# Patient Record
Sex: Female | Born: 1945
Health system: Southern US, Community
[De-identification: ages and names within clinical notes are randomized; demographics above are authoritative.]

## PROBLEM LIST (undated history)

## (undated) DIAGNOSIS — E7800 Pure hypercholesterolemia, unspecified: Secondary | ICD-10-CM

## (undated) DIAGNOSIS — M810 Age-related osteoporosis without current pathological fracture: Secondary | ICD-10-CM

## (undated) HISTORY — PX: CHOLECYSTECTOMY: SHX55

## (undated) HISTORY — PX: EYE SURGERY: SHX253

---

## 2010-07-14 ENCOUNTER — Emergency Department (HOSPITAL_COMMUNITY)
Admission: EM | Admit: 2010-07-14 | Discharge: 2010-07-14 | Disposition: A | Discharge status: Home / Self Care | Payer: Medicare Other | Attending: Emergency Medicine | Admitting: Emergency Medicine

## 2010-07-14 DIAGNOSIS — Z79899 Other long term (current) drug therapy: Secondary | ICD-10-CM | POA: Insufficient documentation

## 2010-07-14 DIAGNOSIS — B372 Candidiasis of skin and nail: Secondary | ICD-10-CM | POA: Insufficient documentation

## 2010-08-01 ENCOUNTER — Ambulatory Visit (INDEPENDENT_AMBULATORY_CARE_PROVIDER_SITE_OTHER): Payer: Medicare Other | Admitting: Otolaryngology

## 2010-08-01 DIAGNOSIS — H8110 Benign paroxysmal vertigo, unspecified ear: Secondary | ICD-10-CM

## 2010-09-05 ENCOUNTER — Ambulatory Visit (INDEPENDENT_AMBULATORY_CARE_PROVIDER_SITE_OTHER): Payer: Medicare Other | Admitting: Otolaryngology

## 2010-09-05 DIAGNOSIS — H8110 Benign paroxysmal vertigo, unspecified ear: Secondary | ICD-10-CM

## 2010-09-26 ENCOUNTER — Ambulatory Visit (INDEPENDENT_AMBULATORY_CARE_PROVIDER_SITE_OTHER): Payer: Medicare Other | Admitting: Otolaryngology

## 2010-09-26 DIAGNOSIS — H6120 Impacted cerumen, unspecified ear: Secondary | ICD-10-CM

## 2010-09-26 DIAGNOSIS — H9209 Otalgia, unspecified ear: Secondary | ICD-10-CM

## 2010-09-26 DIAGNOSIS — H8110 Benign paroxysmal vertigo, unspecified ear: Secondary | ICD-10-CM

## 2011-07-02 ENCOUNTER — Encounter: Payer: Self-pay | Admitting: Pulmonary Disease

## 2011-07-07 ENCOUNTER — Emergency Department (HOSPITAL_COMMUNITY)
Admission: EM | Admit: 2011-07-07 | Discharge: 2011-07-08 | Disposition: A | Discharge status: Home / Self Care | Payer: Medicare Other | Attending: Emergency Medicine | Admitting: Emergency Medicine

## 2011-07-07 ENCOUNTER — Emergency Department (HOSPITAL_COMMUNITY): Payer: Medicare Other

## 2011-07-07 ENCOUNTER — Encounter (HOSPITAL_COMMUNITY): Payer: Self-pay | Admitting: *Deleted

## 2011-07-07 DIAGNOSIS — N898 Other specified noninflammatory disorders of vagina: Secondary | ICD-10-CM | POA: Insufficient documentation

## 2011-07-07 DIAGNOSIS — R112 Nausea with vomiting, unspecified: Secondary | ICD-10-CM | POA: Insufficient documentation

## 2011-07-07 DIAGNOSIS — N939 Abnormal uterine and vaginal bleeding, unspecified: Secondary | ICD-10-CM

## 2011-07-07 DIAGNOSIS — M549 Dorsalgia, unspecified: Secondary | ICD-10-CM | POA: Insufficient documentation

## 2011-07-07 DIAGNOSIS — N949 Unspecified condition associated with female genital organs and menstrual cycle: Secondary | ICD-10-CM | POA: Insufficient documentation

## 2011-07-07 DIAGNOSIS — R109 Unspecified abdominal pain: Secondary | ICD-10-CM | POA: Insufficient documentation

## 2011-07-07 LAB — BASIC METABOLIC PANEL
GFR calc Af Amer: 77 mL/min — ABNORMAL LOW (ref >90)
GFR calc non Af Amer: 66 mL/min — ABNORMAL LOW (ref >90)
Glucose, Bld: 153 mg/dL — ABNORMAL HIGH (ref 70–99)
Potassium: 3.6 mEq/L (ref 3.5–5.1)
Sodium: 139 mEq/L (ref 135–145)

## 2011-07-07 LAB — DIFFERENTIAL
Basophils Relative: 0 % (ref 0–1)
Eosinophils Absolute: 0 10*3/uL (ref 0.0–0.7)
Lymphs Abs: 0.8 10*3/uL (ref 0.7–4.0)
Neutro Abs: 10.4 10*3/uL — ABNORMAL HIGH (ref 1.7–7.7)
Neutrophils Relative %: 87 % — ABNORMAL HIGH (ref 43–77)

## 2011-07-07 LAB — CBC
MCH: 29.4 pg (ref 26.0–34.0)
Platelets: 279 10*3/uL (ref 150–400)
RBC: 4.73 MIL/uL (ref 3.87–5.11)

## 2011-07-07 MED ORDER — SODIUM CHLORIDE 0.9 % IV SOLN: INTRAVENOUS | Status: DC

## 2011-07-07 MED ORDER — SODIUM CHLORIDE 0.9 % IV BOLUS (SEPSIS): 250.0000 mL | Freq: Once | INTRAVENOUS | Status: DC

## 2011-07-07 MED ORDER — HYDROMORPHONE HCL PF 1 MG/ML IJ SOLN
1.0000 mg | Freq: Once | INTRAMUSCULAR | Status: AC
  Administered 2011-07-07: 1 mg via INTRAMUSCULAR

## 2011-07-07 MED ORDER — ONDANSETRON HCL 4 MG/2ML IJ SOLN
4.0000 mg | Freq: Once | INTRAMUSCULAR | Status: DC
  Filled 2011-07-07: qty 2

## 2011-07-07 MED ORDER — ONDANSETRON 4 MG PO TBDP
4.0000 mg | ORAL_TABLET | Freq: Once | ORAL | Status: AC
  Administered 2011-07-07: 4 mg via ORAL
  Filled 2011-07-07: qty 1

## 2011-07-07 MED ORDER — HYDROMORPHONE HCL PF 1 MG/ML IJ SOLN
1.0000 mg | Freq: Once | INTRAMUSCULAR | Status: DC
  Filled 2011-07-07: qty 1

## 2011-07-07 NOTE — ED Notes (Signed)
Attempted x 2 without success in IV access

## 2011-07-07 NOTE — ED Notes (Signed)
Vag bleeding with clots and cramping, Vomiting,.  Low abd pain

## 2011-07-07 NOTE — ED Notes (Signed)
Pt states that cramps started last night and has continued today with vaginal bleeding, +N/V

## 2011-07-07 NOTE — ED Provider Notes (Addendum)
History     CSN: [id number]  Arrival date & time 07/07/11  1733   First MD Initiated Contact with Patient 07/07/11 1939      Chief Complaint  Patient presents with  . Vaginal Bleeding    (Consider location/radiation/quality/duration/timing/severity/associated sxs/prior treatment) The history is provided by the patient.  the patient is a 66 year old female primary care Dr. Is Dr. Juanetta Humphrey. Patient with some vaginal spotting for the past year. Has been taking estrogen for hot flashes related to postmenopausal symptoms. One month ago she had vaginal bleeding for 14 days. Then starting here today with heavy vaginal bleeding and clots lower Donald pain and some nausea and vomiting. Patient did stop the estrogen that she was on how on June 12 there may be a relationship. The abdominal pain is more crampy in nature nonradiating is very sharp at times at worst pain is 8/10 currently is about a 4/10. Patient was referred by her primary care doctor to [facility] she has an appointment in 2 weeks.  History reviewed. No pertinent past medical history.  Past Surgical History  Procedure Date  . Cholecystectomy     History reviewed. No pertinent family history.  History  Substance Use Topics  . Smoking status: Never Smoker   . Smokeless tobacco: Not on file  . Alcohol Use: No    OB History    Grav Para Term Preterm Abortions TAB SAB Ect Mult Living                  Review of Systems  Constitutional: Negative for fever and chills.  HENT: Negative for congestion and neck pain.   Eyes: Negative for redness.  Respiratory: Negative for shortness of breath.   Cardiovascular: Negative for chest pain.  Gastrointestinal: Positive for nausea, vomiting and abdominal pain. Negative for diarrhea.  Genitourinary: Positive for vaginal bleeding and pelvic pain. Negative for dysuria and vaginal discharge.  Musculoskeletal: Positive for back pain.  Skin: Negative for rash.  Neurological:  Negative for headaches.  Hematological: Does not bruise/bleed easily.    Allergies  Other  Home Medications   Current Outpatient Rx  Name Route Sig Dispense Refill  . OLOPATADINE HCL 0.2 % OP SOLN Ophthalmic Apply 1 drop to eye daily.    Marland Kitchen HYDROCODONE-ACETAMINOPHEN 5-325 MG PO TABS Oral Take 1 tablet by mouth every 4 (four) hours as needed for pain. 10 tablet 0  . ONDANSETRON HCL 4 MG PO TABS Oral Take 1 tablet (4 mg total) by mouth every 6 (six) hours. 12 tablet 0    BP 101/85  Pulse 74  Temp 98.4 F (36.9 C) (Oral)  Resp 16  Ht 5\' 1"  (1.549 m)  Wt 215 lb (97.523 kg)  BMI 40.62 kg/m2  SpO2 94%  Physical Exam  Nursing note and vitals reviewed. Constitutional: She is oriented to person, place, and time. She appears well-developed and well-nourished. No distress.  HENT:  Head: Normocephalic and atraumatic.  Mouth/Throat: Oropharynx is clear and moist.  Eyes: Conjunctivae and EOM are normal. Pupils are equal, round, and reactive to light.  Neck: Normal range of motion. Neck supple.  Cardiovascular: Normal rate, regular rhythm and normal heart sounds.   No murmur heard. Pulmonary/Chest: Effort normal and breath sounds normal.  Abdominal: Soft. Bowel sounds are normal. She exhibits no distension and no mass. There is no tenderness. There is no rebound and no guarding.  Genitourinary: Uterus normal. No vaginal discharge found.       On pelvic exam  external genitalia is normalvaginal vault with a small amount of blood no clots no heavy bleeding cervix nontender no cervical motion tenderness uterus difficult to tell size may be a little enlarged nontender adnexal nontender.  Musculoskeletal: Normal range of motion.  Lymphadenopathy:    She has no cervical adenopathy.  Neurological: She is alert and oriented to person, place, and time. No cranial nerve deficit. She exhibits normal muscle tone. Coordination normal.  Skin: Skin is warm. No rash noted.    ED Course  Procedures  (including critical care time)  Labs Reviewed  CBC - Abnormal; Notable for the following:    WBC 11.9 (*)     All other components within normal limits  DIFFERENTIAL - Abnormal; Notable for the following:    Neutrophils Relative 87 (*)     Neutro Abs 10.4 (*)     Lymphocytes Relative 7 (*)     All other components within normal limits  BASIC METABOLIC PANEL - Abnormal; Notable for the following:    Glucose, Bld 153 (*)     GFR calc non Af Amer 66 (*)     GFR calc Af Amer 77 (*)     All other components within normal limits  LAB REPORT - SCANNED   Ct Abdomen Pelvis Wo Contrast  07/07/2011  *RADIOLOGY REPORT*  Clinical Data: Vaginal bleeding.  CT ABDOMEN AND PELVIS WITHOUT CONTRAST  Technique:  Multidetector CT imaging of the abdomen and pelvis was performed following the standard protocol without intravenous contrast.  Comparison: None.  Findings: The visualized lung bases are clear.  The liver and spleen are unremarkable in appearance.  The patient is status post cholecystectomy, with clips noted along the gallbladder fossa.  The pancreas and adrenal glands are unremarkable.  The kidneys are unremarkable in appearance.  There is no evidence of hydronephrosis.  No renal or ureteral stones are seen.  No perinephric stranding is appreciated.  No free fluid is identified.  The small bowel is unremarkable in appearance.  The stomach is within normal limits.  No acute vascular abnormalities are seen.  The appendix is normal in caliber and contains minimal contrast, without evidence for appendicitis.  Minimal diverticulosis is noted at the distal descending colon.  The colon is otherwise unremarkable in appearance.  Contrast progresses to the level of the mid transverse colon.  The bladder is largely decompressed and grossly unremarkable in appearance.  The uterus is grossly unremarkable in appearance; a small hypodensity at the cervix is thought to reflect a Nabothian cyst, though this cannot be  confirmed on CT.  The ovaries are relatively symmetric; no suspicious adnexal masses are seen.  No inguinal lymphadenopathy is seen.  No acute osseous abnormalities are identified.  Chronic sclerotic degenerative change is noted within the L5 vertebral body, with endplate irregularity and this space narrowing at L4-L5, and vacuum phenomenon L5-S1.  An associated focus of degenerative fat is seen within the vertebral body.  This may reflect remote sequelae of infection.  IMPRESSION:  1.  Uterus grossly unremarkable in appearance on CT, though difficult to fully assess on noncontrast images; a small hypodensity at the cervix is thought to reflect a Nabothian cyst, though this cannot be confirmed on CT. 2.  Minimal diverticulosis along the distal descending colon, without evidence for diverticulitis.  3.  Chronic sclerotic degenerative change within the L5 vertebral body, with surrounding degenerative change and associated intravertebral fat.  This may reflect remote sequelae of infection; no definite acute abnormalities identified.  Original  Report Authenticated By: Tonia Ghent, M.D.   Results for orders placed during the hospital encounter of 07/07/11  CBC      Component Value Range   WBC 11.9 (*) 4.0 - 10.5 K/uL   RBC 4.73  3.87 - 5.11 MIL/uL   Hemoglobin 13.9  12.0 - 15.0 g/dL   HCT 16.1  09.6 - 04.5 %   MCV 87.5  78.0 - 100.0 fL   MCH 29.4  26.0 - 34.0 pg   MCHC 33.6  30.0 - 36.0 g/dL   RDW 40.9  81.1 - 91.4 %   Platelets 279  150 - 400 K/uL  DIFFERENTIAL      Component Value Range   Neutrophils Relative 87 (*) 43 - 77 %   Neutro Abs 10.4 (*) 1.7 - 7.7 K/uL   Lymphocytes Relative 7 (*) 12 - 46 %   Lymphs Abs 0.8  0.7 - 4.0 K/uL   Monocytes Relative 6  3 - 12 %   Monocytes Absolute 0.7  0.1 - 1.0 K/uL   Eosinophils Relative 0  0 - 5 %   Eosinophils Absolute 0.0  0.0 - 0.7 K/uL   Basophils Relative 0  0 - 1 %   Basophils Absolute 0.0  0.0 - 0.1 K/uL  BASIC METABOLIC PANEL      Component  Value Range   Sodium 139  135 - 145 mEq/L   Potassium 3.6  3.5 - 5.1 mEq/L   Chloride 101  96 - 112 mEq/L   CO2 26  19 - 32 mEq/L   Glucose, Bld 153 (*) 70 - 99 mg/dL   BUN 19  6 - 23 mg/dL   Creatinine, Ser 7.82  0.50 - 1.10 mg/dL   Calcium 95.6  8.4 - 21.3 mg/dL   GFR calc non Af Amer 66 (*) >90 mL/min   GFR calc Af Amer 77 (*) >90 mL/min     1. Vaginal bleeding   2. Abdominal pain       MDM  Patient with some vaginal bleeding and spotting for the past year she is clearly postmenopausal. Dr. Juanetta Humphrey her primary care doctor is [facility] referral to OB/GYN locally. First appointment for her is in the beginning of July. Patient a month ago had bleeding for about 14 days vaginally. Then starting just today started with heavy bleeding and clots and cramping and some vomiting associated with low Donald pain. Patient has been on estrogen she stopped that on June 12 just a few days ago that may be related. CT is being ordered to evaluate further followup with GYN will be important. Complete blood count without any significant anemia.  CT results are still pending and will be followed up by the overnight emergency physician. followup with OB/GYN will be important. Uterine biopsy may be required.        Shelda Jakes, MD 07/07/11 0865  Shelda Jakes, MD 07/08/11 1327

## 2011-07-08 MED ORDER — ONDANSETRON HCL 4 MG PO TABS: 4.0000 mg | ORAL_TABLET | Freq: Four times a day (QID) | ORAL | Status: AC

## 2011-07-08 MED ORDER — HYDROCODONE-ACETAMINOPHEN 5-325 MG PO TABS: 1.0000 | ORAL_TABLET | ORAL | Status: AC | PRN

## 2011-07-08 NOTE — Discharge Instructions (Signed)
Your CT did not show anything serious in the abdomen. Use the pain and nausea medicine as needed.Keep your appointment with Dr. Despina Hidden. Call his office tomorrow and see if there are any earlier appointments and place yourself on the first cancellation list.    Abnormal Vaginal Bleeding Abnormal vaginal bleeding means bleeding from the vagina that is not your normal menstrual period. Bleeding may be heavy or light. It may last for days or come and go. There are many problems that may cause this. HOME CARE  Keep track of your periods on a calendar if they are not regular.   Write down:   How often pads or tampons are changed.   The size and number of clots, if there are any.   A change in the color of the blood.   A change in the amount of blood.   Any smell.   The time and strength of cramps or pain.   Limit activity as told.   Eat a healthy diet.   Do not have sex (intercourse) until your doctor says it is okay.   Never have unprotected sex unless you are trying to get pregnant.   Only take medicine as told by your doctor.  GET HELP RIGHT AWAY IF:   You get dizzy or feel faint when standing up.   You have to change pads or tampons more than once an hour.   You feel a sudden change in your pain.   You start bleeding heavily.   You develop a fever.  MAKE SURE YOU:  Understand these instructions.   Will watch your condition.   Will get help right away if you are not doing well or get worse.  Document Released: 11/03/2008 Document Revised: 12/26/2010 Document Reviewed: 11/03/2008 [facility] Patient Information 2012 [place], [place].

## 2011-07-08 NOTE — Progress Notes (Signed)
2300 Assumed care/disposition of patient here with dysfunctional uterine bleeding. There has been associated cramping and nausea and vomiting. Awaiting CT abdomen and pelvis. 6213 CT is negative for acute process. Reviewed results with patient. She advised that she has had intermittent vaginal bleeding for over a year. She has had two D& Cs in her lifetime. Her appointment with Dr. Despina Hidden, OB/GYN is the first week of July. She reports she was on estrogen in the past but that had been discontinued. Will provide her with Rx for nausea and pain. She will keep her appointment with Dr. Despina Hidden and call his office to be placed on first cancellation list.Pt stable in ED with no significant deterioration in condition.The patient appears reasonably screened and/or stabilized for discharge and I doubt any other medical condition or other [facility] requiring further screening, evaluation, or treatment in the ED at this time prior to discharge.  Results for orders placed during the hospital encounter of 07/07/11  CBC      Component Value Range   WBC 11.9 (*) 4.0 - 10.5 K/uL   RBC 4.73  3.87 - 5.11 MIL/uL   Hemoglobin 13.9  12.0 - 15.0 g/dL   HCT 08.6  57.8 - 46.9 %   MCV 87.5  78.0 - 100.0 fL   MCH 29.4  26.0 - 34.0 pg   MCHC 33.6  30.0 - 36.0 g/dL   RDW 62.9  52.8 - 41.3 %   Platelets 279  150 - 400 K/uL  DIFFERENTIAL      Component Value Range   Neutrophils Relative 87 (*) 43 - 77 %   Neutro Abs 10.4 (*) 1.7 - 7.7 K/uL   Lymphocytes Relative 7 (*) 12 - 46 %   Lymphs Abs 0.8  0.7 - 4.0 K/uL   Monocytes Relative 6  3 - 12 %   Monocytes Absolute 0.7  0.1 - 1.0 K/uL   Eosinophils Relative 0  0 - 5 %   Eosinophils Absolute 0.0  0.0 - 0.7 K/uL   Basophils Relative 0  0 - 1 %   Basophils Absolute 0.0  0.0 - 0.1 K/uL  BASIC METABOLIC PANEL      Component Value Range   Sodium 139  135 - 145 mEq/L   Potassium 3.6  3.5 - 5.1 mEq/L   Chloride 101  96 - 112 mEq/L   CO2 26  19 - 32 mEq/L   Glucose, Bld 153 (*) 70 - 99  mg/dL   BUN 19  6 - 23 mg/dL   Creatinine, Ser 2.44  0.50 - 1.10 mg/dL   Calcium 01.0  8.4 - 27.2 mg/dL   GFR calc non Af Amer 66 (*) >90 mL/min   GFR calc Af Amer 77 (*) >90 mL/min   Ct Abdomen Pelvis Wo Contrast  07/07/2011  *RADIOLOGY REPORT*  Clinical Data: Vaginal bleeding.  CT ABDOMEN AND PELVIS WITHOUT CONTRAST  Technique:  Multidetector CT imaging of the abdomen and pelvis was performed following the standard protocol without intravenous contrast.  Comparison: None.  Findings: The visualized lung bases are clear.  The liver and spleen are unremarkable in appearance.  The patient is status post cholecystectomy, with clips noted along the gallbladder fossa.  The pancreas and adrenal glands are unremarkable.  The kidneys are unremarkable in appearance.  There is no evidence of hydronephrosis.  No renal or ureteral stones are seen.  No perinephric stranding is appreciated.  No free fluid is identified.  The small bowel is unremarkable in  appearance.  The stomach is within normal limits.  No acute vascular abnormalities are seen.  The appendix is normal in caliber and contains minimal contrast, without evidence for appendicitis.  Minimal diverticulosis is noted at the distal descending colon.  The colon is otherwise unremarkable in appearance.  Contrast progresses to the level of the mid transverse colon.  The bladder is largely decompressed and grossly unremarkable in appearance.  The uterus is grossly unremarkable in appearance; a small hypodensity at the cervix is thought to reflect a Nabothian cyst, though this cannot be confirmed on CT.  The ovaries are relatively symmetric; no suspicious adnexal masses are seen.  No inguinal lymphadenopathy is seen.  No acute osseous abnormalities are identified.  Chronic sclerotic degenerative change is noted within the L5 vertebral body, with endplate irregularity and this space narrowing at L4-L5, and vacuum phenomenon L5-S1.  An associated focus of degenerative  fat is seen within the vertebral body.  This may reflect remote sequelae of infection.  IMPRESSION:  1.  Uterus grossly unremarkable in appearance on CT, though difficult to fully assess on noncontrast images; a small hypodensity at the cervix is thought to reflect a Nabothian cyst, though this cannot be confirmed on CT. 2.  Minimal diverticulosis along the distal descending colon, without evidence for diverticulitis.  3.  Chronic sclerotic degenerative change within the L5 vertebral body, with surrounding degenerative change and associated intravertebral fat.  This may reflect remote sequelae of infection; no definite acute abnormalities identified.  Original Report Authenticated By: Tonia Ghent, M.D.

## 2012-04-19 ENCOUNTER — Other Ambulatory Visit: Payer: Self-pay | Admitting: Obstetrics & Gynecology

## 2012-04-21 ENCOUNTER — Other Ambulatory Visit (HOSPITAL_COMMUNITY): Payer: Self-pay | Admitting: Neurological Surgery

## 2012-04-21 DIAGNOSIS — I671 Cerebral aneurysm, nonruptured: Secondary | ICD-10-CM

## 2012-04-26 ENCOUNTER — Ambulatory Visit (HOSPITAL_COMMUNITY)
Admission: RE | Admit: 2012-04-26 | Discharge: 2012-04-26 | Disposition: A | Discharge status: Home / Self Care | Payer: Medicare Other | Source: Ambulatory Visit | Attending: Neurological Surgery | Admitting: Neurological Surgery

## 2012-04-26 DIAGNOSIS — I671 Cerebral aneurysm, nonruptured: Secondary | ICD-10-CM

## 2012-04-26 DIAGNOSIS — H5789 Other specified disorders of eye and adnexa: Secondary | ICD-10-CM | POA: Insufficient documentation

## 2012-04-26 DIAGNOSIS — I6789 Other cerebrovascular disease: Secondary | ICD-10-CM | POA: Insufficient documentation

## 2012-04-26 DIAGNOSIS — H5710 Ocular pain, unspecified eye: Secondary | ICD-10-CM | POA: Insufficient documentation

## 2012-04-26 DIAGNOSIS — G319 Degenerative disease of nervous system, unspecified: Secondary | ICD-10-CM | POA: Insufficient documentation

## 2012-04-26 DIAGNOSIS — H532 Diplopia: Secondary | ICD-10-CM | POA: Insufficient documentation

## 2012-04-26 LAB — POCT I-STAT, CHEM 8
Chloride: 112 mEq/L (ref 96–112)
HCT: 43 % (ref 36.0–46.0)
Potassium: 3.5 mEq/L (ref 3.5–5.1)

## 2012-04-26 MED ORDER — GADOBENATE DIMEGLUMINE 529 MG/ML IV SOLN
20.0000 mL | Freq: Once | INTRAVENOUS | Status: AC | PRN
  Administered 2012-04-26: 20 mL via INTRAVENOUS

## 2012-04-26 NOTE — Progress Notes (Signed)
Blood sample obtained from left arm IV for Creatnine level.  

## 2012-05-17 ENCOUNTER — Other Ambulatory Visit: Payer: Self-pay | Admitting: Obstetrics & Gynecology

## 2012-06-15 ENCOUNTER — Other Ambulatory Visit: Payer: Self-pay | Admitting: Obstetrics & Gynecology

## 2012-07-19 ENCOUNTER — Other Ambulatory Visit: Payer: Self-pay | Admitting: Obstetrics & Gynecology

## 2012-11-18 ENCOUNTER — Other Ambulatory Visit: Payer: Self-pay | Admitting: *Deleted

## 2012-11-18 MED ORDER — MEDROXYPROGESTERONE ACETATE 10 MG PO TABS: ORAL_TABLET | ORAL | Status: DC

## 2013-03-19 ENCOUNTER — Other Ambulatory Visit: Payer: Self-pay | Admitting: Obstetrics & Gynecology

## 2013-03-21 ENCOUNTER — Other Ambulatory Visit: Payer: Medicare Other | Admitting: Obstetrics & Gynecology

## 2013-03-21 ENCOUNTER — Encounter (INDEPENDENT_AMBULATORY_CARE_PROVIDER_SITE_OTHER): Payer: Self-pay

## 2013-06-16 ENCOUNTER — Other Ambulatory Visit (HOSPITAL_COMMUNITY): Payer: Self-pay | Admitting: Pulmonary Disease

## 2013-06-16 DIAGNOSIS — I639 Cerebral infarction, unspecified: Secondary | ICD-10-CM

## 2013-06-22 ENCOUNTER — Ambulatory Visit (HOSPITAL_COMMUNITY)
Admission: RE | Admit: 2013-06-22 | Discharge: 2013-06-22 | Disposition: A | Discharge status: Home / Self Care | Payer: Medicare Other | Source: Ambulatory Visit | Attending: Pulmonary Disease | Admitting: Pulmonary Disease

## 2013-06-22 DIAGNOSIS — I517 Cardiomegaly: Secondary | ICD-10-CM

## 2013-06-22 DIAGNOSIS — R42 Dizziness and giddiness: Secondary | ICD-10-CM | POA: Insufficient documentation

## 2013-06-22 DIAGNOSIS — Z8673 Personal history of transient ischemic attack (TIA), and cerebral infarction without residual deficits: Secondary | ICD-10-CM | POA: Insufficient documentation

## 2013-06-22 DIAGNOSIS — H539 Unspecified visual disturbance: Secondary | ICD-10-CM | POA: Insufficient documentation

## 2013-06-22 DIAGNOSIS — I6350 Cerebral infarction due to unspecified occlusion or stenosis of unspecified cerebral artery: Secondary | ICD-10-CM | POA: Insufficient documentation

## 2013-06-22 DIAGNOSIS — I639 Cerebral infarction, unspecified: Secondary | ICD-10-CM

## 2013-06-22 NOTE — Progress Notes (Signed)
  Echocardiogram 2D Echocardiogram has been performed.  Renae Fickle Erie Radu 06/22/2013, 2:40 PM

## 2013-11-28 LAB — HM DIABETES EYE EXAM

## 2015-01-25 DIAGNOSIS — I69998 Other sequelae following unspecified cerebrovascular disease: Secondary | ICD-10-CM | POA: Diagnosis not present

## 2015-01-25 DIAGNOSIS — H409 Unspecified glaucoma: Secondary | ICD-10-CM | POA: Diagnosis not present

## 2015-02-01 DIAGNOSIS — H4051X1 Glaucoma secondary to other eye disorders, right eye, mild stage: Secondary | ICD-10-CM | POA: Diagnosis not present

## 2015-02-01 DIAGNOSIS — H35031 Hypertensive retinopathy, right eye: Secondary | ICD-10-CM | POA: Diagnosis not present

## 2015-02-01 DIAGNOSIS — H35033 Hypertensive retinopathy, bilateral: Secondary | ICD-10-CM | POA: Diagnosis not present

## 2015-02-01 DIAGNOSIS — Z8679 Personal history of other diseases of the circulatory system: Secondary | ICD-10-CM | POA: Diagnosis not present

## 2015-02-01 DIAGNOSIS — H40022 Open angle with borderline findings, high risk, left eye: Secondary | ICD-10-CM | POA: Diagnosis not present

## 2015-02-01 DIAGNOSIS — H25013 Cortical age-related cataract, bilateral: Secondary | ICD-10-CM | POA: Diagnosis not present

## 2015-02-01 DIAGNOSIS — H43813 Vitreous degeneration, bilateral: Secondary | ICD-10-CM | POA: Diagnosis not present

## 2015-08-08 DIAGNOSIS — H1013 Acute atopic conjunctivitis, bilateral: Secondary | ICD-10-CM | POA: Diagnosis not present

## 2015-08-08 DIAGNOSIS — H4051X1 Glaucoma secondary to other eye disorders, right eye, mild stage: Secondary | ICD-10-CM | POA: Diagnosis not present

## 2015-08-08 DIAGNOSIS — Z79899 Other long term (current) drug therapy: Secondary | ICD-10-CM | POA: Diagnosis not present

## 2015-08-08 DIAGNOSIS — H40022 Open angle with borderline findings, high risk, left eye: Secondary | ICD-10-CM | POA: Diagnosis not present

## 2015-09-20 DIAGNOSIS — Z634 Disappearance and death of family member: Secondary | ICD-10-CM | POA: Diagnosis not present

## 2015-09-20 DIAGNOSIS — H409 Unspecified glaucoma: Secondary | ICD-10-CM | POA: Diagnosis not present

## 2015-09-20 DIAGNOSIS — J309 Allergic rhinitis, unspecified: Secondary | ICD-10-CM | POA: Diagnosis not present

## 2015-09-20 DIAGNOSIS — G4700 Insomnia, unspecified: Secondary | ICD-10-CM | POA: Diagnosis not present

## 2015-10-04 DIAGNOSIS — H4051X1 Glaucoma secondary to other eye disorders, right eye, mild stage: Secondary | ICD-10-CM | POA: Diagnosis not present

## 2015-10-04 DIAGNOSIS — H43813 Vitreous degeneration, bilateral: Secondary | ICD-10-CM | POA: Diagnosis not present

## 2015-10-04 DIAGNOSIS — H2513 Age-related nuclear cataract, bilateral: Secondary | ICD-10-CM | POA: Diagnosis not present

## 2015-10-04 DIAGNOSIS — H40022 Open angle with borderline findings, high risk, left eye: Secondary | ICD-10-CM | POA: Diagnosis not present

## 2015-12-24 DIAGNOSIS — J309 Allergic rhinitis, unspecified: Secondary | ICD-10-CM | POA: Diagnosis not present

## 2015-12-24 DIAGNOSIS — G4700 Insomnia, unspecified: Secondary | ICD-10-CM | POA: Diagnosis not present

## 2015-12-24 DIAGNOSIS — M13862 Other specified arthritis, left knee: Secondary | ICD-10-CM | POA: Diagnosis not present

## 2016-04-14 DIAGNOSIS — H40022 Open angle with borderline findings, high risk, left eye: Secondary | ICD-10-CM | POA: Diagnosis not present

## 2016-04-14 DIAGNOSIS — H21561 Pupillary abnormality, right eye: Secondary | ICD-10-CM | POA: Diagnosis not present

## 2016-04-14 DIAGNOSIS — H4051X1 Glaucoma secondary to other eye disorders, right eye, mild stage: Secondary | ICD-10-CM | POA: Diagnosis not present

## 2016-06-23 DIAGNOSIS — G4700 Insomnia, unspecified: Secondary | ICD-10-CM | POA: Diagnosis not present

## 2016-06-23 DIAGNOSIS — H409 Unspecified glaucoma: Secondary | ICD-10-CM | POA: Diagnosis not present

## 2016-06-23 DIAGNOSIS — J301 Allergic rhinitis due to pollen: Secondary | ICD-10-CM | POA: Diagnosis not present

## 2016-10-09 DIAGNOSIS — H35041 Retinal micro-aneurysms, unspecified, right eye: Secondary | ICD-10-CM | POA: Diagnosis not present

## 2016-10-09 DIAGNOSIS — H3589 Other specified retinal disorders: Secondary | ICD-10-CM | POA: Diagnosis not present

## 2016-10-09 DIAGNOSIS — H4051X1 Glaucoma secondary to other eye disorders, right eye, mild stage: Secondary | ICD-10-CM | POA: Diagnosis not present

## 2016-10-09 DIAGNOSIS — H40022 Open angle with borderline findings, high risk, left eye: Secondary | ICD-10-CM | POA: Diagnosis not present

## 2016-11-06 DIAGNOSIS — H35371 Puckering of macula, right eye: Secondary | ICD-10-CM | POA: Diagnosis not present

## 2016-11-06 DIAGNOSIS — H25813 Combined forms of age-related cataract, bilateral: Secondary | ICD-10-CM | POA: Diagnosis not present

## 2016-11-06 DIAGNOSIS — H34831 Tributary (branch) retinal vein occlusion, right eye, with macular edema: Secondary | ICD-10-CM | POA: Diagnosis not present

## 2016-11-06 DIAGNOSIS — H4051X1 Glaucoma secondary to other eye disorders, right eye, mild stage: Secondary | ICD-10-CM | POA: Diagnosis not present

## 2016-11-10 DIAGNOSIS — H4051X1 Glaucoma secondary to other eye disorders, right eye, mild stage: Secondary | ICD-10-CM | POA: Diagnosis not present

## 2016-11-24 DIAGNOSIS — H40022 Open angle with borderline findings, high risk, left eye: Secondary | ICD-10-CM | POA: Diagnosis not present

## 2017-01-07 DIAGNOSIS — H1013 Acute atopic conjunctivitis, bilateral: Secondary | ICD-10-CM | POA: Diagnosis not present

## 2017-01-07 DIAGNOSIS — H4051X1 Glaucoma secondary to other eye disorders, right eye, mild stage: Secondary | ICD-10-CM | POA: Diagnosis not present

## 2017-01-07 DIAGNOSIS — H40022 Open angle with borderline findings, high risk, left eye: Secondary | ICD-10-CM | POA: Diagnosis not present

## 2017-01-07 DIAGNOSIS — H40053 Ocular hypertension, bilateral: Secondary | ICD-10-CM | POA: Diagnosis not present

## 2017-01-28 DIAGNOSIS — H409 Unspecified glaucoma: Secondary | ICD-10-CM | POA: Diagnosis not present

## 2017-01-28 DIAGNOSIS — J301 Allergic rhinitis due to pollen: Secondary | ICD-10-CM | POA: Diagnosis not present

## 2017-02-19 DIAGNOSIS — H34831 Tributary (branch) retinal vein occlusion, right eye, with macular edema: Secondary | ICD-10-CM | POA: Diagnosis not present

## 2017-02-19 DIAGNOSIS — H35371 Puckering of macula, right eye: Secondary | ICD-10-CM | POA: Diagnosis not present

## 2017-02-19 DIAGNOSIS — H25813 Combined forms of age-related cataract, bilateral: Secondary | ICD-10-CM | POA: Diagnosis not present

## 2017-02-19 DIAGNOSIS — H4051X1 Glaucoma secondary to other eye disorders, right eye, mild stage: Secondary | ICD-10-CM | POA: Diagnosis not present

## 2017-04-08 ENCOUNTER — Other Ambulatory Visit (HOSPITAL_COMMUNITY): Payer: Self-pay | Admitting: Pulmonary Disease

## 2017-04-08 DIAGNOSIS — Z1231 Encounter for screening mammogram for malignant neoplasm of breast: Secondary | ICD-10-CM

## 2017-04-09 ENCOUNTER — Ambulatory Visit (HOSPITAL_COMMUNITY)
Admission: RE | Admit: 2017-04-09 | Discharge: 2017-04-09 | Disposition: A | Discharge status: Home / Self Care | Payer: PPO | Source: Ambulatory Visit | Attending: Pulmonary Disease | Admitting: Pulmonary Disease

## 2017-04-09 ENCOUNTER — Encounter (HOSPITAL_COMMUNITY): Payer: Self-pay

## 2017-04-09 DIAGNOSIS — Z1231 Encounter for screening mammogram for malignant neoplasm of breast: Secondary | ICD-10-CM | POA: Diagnosis not present

## 2017-05-27 DIAGNOSIS — H40022 Open angle with borderline findings, high risk, left eye: Secondary | ICD-10-CM | POA: Diagnosis not present

## 2017-05-27 DIAGNOSIS — H1013 Acute atopic conjunctivitis, bilateral: Secondary | ICD-10-CM | POA: Diagnosis not present

## 2017-05-27 DIAGNOSIS — H21561 Pupillary abnormality, right eye: Secondary | ICD-10-CM | POA: Diagnosis not present

## 2017-05-27 DIAGNOSIS — H4051X1 Glaucoma secondary to other eye disorders, right eye, mild stage: Secondary | ICD-10-CM | POA: Diagnosis not present

## 2017-07-28 DIAGNOSIS — Z0000 Encounter for general adult medical examination without abnormal findings: Secondary | ICD-10-CM | POA: Diagnosis not present

## 2017-07-29 ENCOUNTER — Encounter: Payer: Self-pay | Admitting: Pulmonary Disease

## 2017-07-29 DIAGNOSIS — Z0000 Encounter for general adult medical examination without abnormal findings: Secondary | ICD-10-CM | POA: Diagnosis not present

## 2017-07-29 DIAGNOSIS — T7849XS Other allergy, sequela: Secondary | ICD-10-CM | POA: Diagnosis not present

## 2017-07-29 DIAGNOSIS — Z634 Disappearance and death of family member: Secondary | ICD-10-CM | POA: Diagnosis not present

## 2017-07-29 DIAGNOSIS — H409 Unspecified glaucoma: Secondary | ICD-10-CM | POA: Diagnosis not present

## 2017-07-29 DIAGNOSIS — J309 Allergic rhinitis, unspecified: Secondary | ICD-10-CM | POA: Diagnosis not present

## 2017-07-29 DIAGNOSIS — N939 Abnormal uterine and vaginal bleeding, unspecified: Secondary | ICD-10-CM | POA: Diagnosis not present

## 2017-07-29 DIAGNOSIS — M13862 Other specified arthritis, left knee: Secondary | ICD-10-CM | POA: Diagnosis not present

## 2017-07-29 DIAGNOSIS — I69998 Other sequelae following unspecified cerebrovascular disease: Secondary | ICD-10-CM | POA: Diagnosis not present

## 2017-07-30 LAB — TSH: TSH: 4.3

## 2017-10-01 DIAGNOSIS — H34831 Tributary (branch) retinal vein occlusion, right eye, with macular edema: Secondary | ICD-10-CM | POA: Diagnosis not present

## 2017-10-01 DIAGNOSIS — H35371 Puckering of macula, right eye: Secondary | ICD-10-CM | POA: Diagnosis not present

## 2017-10-01 DIAGNOSIS — H25813 Combined forms of age-related cataract, bilateral: Secondary | ICD-10-CM | POA: Diagnosis not present

## 2017-10-01 DIAGNOSIS — H4051X1 Glaucoma secondary to other eye disorders, right eye, mild stage: Secondary | ICD-10-CM | POA: Diagnosis not present

## 2017-12-14 DIAGNOSIS — H35033 Hypertensive retinopathy, bilateral: Secondary | ICD-10-CM | POA: Diagnosis not present

## 2017-12-14 DIAGNOSIS — H4051X1 Glaucoma secondary to other eye disorders, right eye, mild stage: Secondary | ICD-10-CM | POA: Diagnosis not present

## 2017-12-14 DIAGNOSIS — H40022 Open angle with borderline findings, high risk, left eye: Secondary | ICD-10-CM | POA: Diagnosis not present

## 2017-12-14 DIAGNOSIS — H34831 Tributary (branch) retinal vein occlusion, right eye, with macular edema: Secondary | ICD-10-CM | POA: Diagnosis not present

## 2018-02-01 DIAGNOSIS — E785 Hyperlipidemia, unspecified: Secondary | ICD-10-CM | POA: Diagnosis not present

## 2018-02-01 DIAGNOSIS — J301 Allergic rhinitis due to pollen: Secondary | ICD-10-CM | POA: Diagnosis not present

## 2018-02-01 DIAGNOSIS — M13862 Other specified arthritis, left knee: Secondary | ICD-10-CM | POA: Diagnosis not present

## 2018-02-01 DIAGNOSIS — I69998 Other sequelae following unspecified cerebrovascular disease: Secondary | ICD-10-CM | POA: Diagnosis not present

## 2018-02-10 DIAGNOSIS — Z1212 Encounter for screening for malignant neoplasm of rectum: Secondary | ICD-10-CM | POA: Diagnosis not present

## 2018-02-10 DIAGNOSIS — Z1211 Encounter for screening for malignant neoplasm of colon: Secondary | ICD-10-CM | POA: Diagnosis not present

## 2018-02-10 LAB — COLOGUARD: Cologuard: NEGATIVE

## 2018-03-08 ENCOUNTER — Other Ambulatory Visit (HOSPITAL_COMMUNITY): Payer: Self-pay | Admitting: Pulmonary Disease

## 2018-03-08 DIAGNOSIS — Z1231 Encounter for screening mammogram for malignant neoplasm of breast: Secondary | ICD-10-CM

## 2018-04-12 ENCOUNTER — Ambulatory Visit (HOSPITAL_COMMUNITY): Payer: PPO

## 2018-05-10 ENCOUNTER — Ambulatory Visit (HOSPITAL_COMMUNITY): Payer: PPO

## 2018-06-18 DIAGNOSIS — H401111 Primary open-angle glaucoma, right eye, mild stage: Secondary | ICD-10-CM | POA: Diagnosis not present

## 2018-06-18 DIAGNOSIS — H40022 Open angle with borderline findings, high risk, left eye: Secondary | ICD-10-CM | POA: Diagnosis not present

## 2018-06-18 DIAGNOSIS — H1045 Other chronic allergic conjunctivitis: Secondary | ICD-10-CM | POA: Diagnosis not present

## 2018-06-28 ENCOUNTER — Ambulatory Visit (HOSPITAL_COMMUNITY)
Admission: RE | Admit: 2018-06-28 | Discharge: 2018-06-28 | Disposition: A | Discharge status: Home / Self Care | Payer: PPO | Source: Ambulatory Visit | Attending: Pulmonary Disease | Admitting: Pulmonary Disease

## 2018-06-28 ENCOUNTER — Other Ambulatory Visit: Payer: Self-pay

## 2018-06-28 DIAGNOSIS — Z1231 Encounter for screening mammogram for malignant neoplasm of breast: Secondary | ICD-10-CM | POA: Diagnosis not present

## 2018-06-28 LAB — HM MAMMOGRAPHY

## 2018-07-31 LAB — LIPID PANEL
Cholesterol, Total: 243
HDL Cholesterol: 89 — AB (ref 35–70)
LDL Cholesterol: 140
Triglycerides: 70 (ref 40–160)
VLDL Cholesterol Cal: 14

## 2018-08-02 ENCOUNTER — Other Ambulatory Visit (HOSPITAL_COMMUNITY): Payer: Self-pay | Admitting: Pulmonary Disease

## 2018-08-02 DIAGNOSIS — Z0000 Encounter for general adult medical examination without abnormal findings: Secondary | ICD-10-CM | POA: Diagnosis not present

## 2018-08-02 DIAGNOSIS — I69998 Other sequelae following unspecified cerebrovascular disease: Secondary | ICD-10-CM | POA: Diagnosis not present

## 2018-08-02 DIAGNOSIS — M13862 Other specified arthritis, left knee: Secondary | ICD-10-CM | POA: Diagnosis not present

## 2018-08-02 DIAGNOSIS — H409 Unspecified glaucoma: Secondary | ICD-10-CM | POA: Diagnosis not present

## 2018-08-02 DIAGNOSIS — Z780 Asymptomatic menopausal state: Secondary | ICD-10-CM

## 2018-08-03 LAB — URINALYSIS
Bilirubin: NEGATIVE
Casts: NONE SEEN
Epithelial Cells (non renal): >10
Glucose: NEGATIVE
Ketones: NEGATIVE
Nitrite: NEGATIVE
Occult Blood: NEGATIVE
Specific Gravity: 1.024
Urobilinogen, Ur: 0.2
pH: 5.5

## 2018-08-03 LAB — CBC WITH DIFF/PLATELET
BASO(ABSOLUTE): 0.1
Basophils: 1
Eosinophils Absolute: 0
Eosinophils, %: 1
HCT: 40 (ref 29–41)
Hemoglobin: 13.8
Immature Granulocytes: 0
LYMPH: 29 %
Lymphs Abs: 2.6
MCH: 30
MCHC: 34.4
MCV: 87 (ref 76–111)
Monocytes(Absolute): 0.7
Monocytes: 7
Neutro Abs: 5.4
Neutrophils: 62
RBC: 4.6 (ref 3.87–5.11)
RDW: 13.3
WBC: 8.8
platelet count: 251

## 2018-08-03 LAB — LIPID PANEL
Cholesterol, Total: 165
HDL Cholesterol: 67 (ref 35–70)
LDL Cholesterol: 83
Triglycerides: 76 (ref 40–160)
VLDL Cholesterol Cal: 15

## 2018-08-03 LAB — CMP 10231
ALT: 27 (ref 3–30)
AST: 27
Albumin/Globulin Ratio: 1.9
Albumin: 4.4
Alkaline Phosphatase: 142
BUN/Creatinine Ratio: 14
BUN: 13 (ref 4–21)
Calcium: 10.1
Carbon Dioxide, Total: 22
Chloride: 103
Creat: 0.96
EGFR (African American): 67
EGFR (Non-African Amer.): 58
Globulin, Total: 2.3
Glucose: 72
Potassium: 4.2
Sodium: 143
Total Bilirubin: 0.4
Total Protein: 6.7 (ref 6.4–8.2)

## 2018-08-03 LAB — TSH: TSH: 2.6

## 2018-08-19 ENCOUNTER — Other Ambulatory Visit: Payer: Self-pay

## 2019-01-03 DIAGNOSIS — H2513 Age-related nuclear cataract, bilateral: Secondary | ICD-10-CM | POA: Diagnosis not present

## 2019-01-03 DIAGNOSIS — H34831 Tributary (branch) retinal vein occlusion, right eye, with macular edema: Secondary | ICD-10-CM | POA: Diagnosis not present

## 2019-01-03 DIAGNOSIS — H401111 Primary open-angle glaucoma, right eye, mild stage: Secondary | ICD-10-CM | POA: Diagnosis not present

## 2019-01-03 DIAGNOSIS — H25013 Cortical age-related cataract, bilateral: Secondary | ICD-10-CM | POA: Diagnosis not present

## 2019-01-03 DIAGNOSIS — H40022 Open angle with borderline findings, high risk, left eye: Secondary | ICD-10-CM | POA: Diagnosis not present

## 2019-01-18 DIAGNOSIS — H25811 Combined forms of age-related cataract, right eye: Secondary | ICD-10-CM | POA: Diagnosis not present

## 2019-01-18 DIAGNOSIS — H2511 Age-related nuclear cataract, right eye: Secondary | ICD-10-CM | POA: Diagnosis not present

## 2019-02-16 DIAGNOSIS — H2512 Age-related nuclear cataract, left eye: Secondary | ICD-10-CM | POA: Diagnosis not present

## 2019-02-16 DIAGNOSIS — H25012 Cortical age-related cataract, left eye: Secondary | ICD-10-CM | POA: Diagnosis not present

## 2019-02-22 DIAGNOSIS — H25012 Cortical age-related cataract, left eye: Secondary | ICD-10-CM | POA: Diagnosis not present

## 2019-02-22 DIAGNOSIS — H2512 Age-related nuclear cataract, left eye: Secondary | ICD-10-CM | POA: Diagnosis not present

## 2019-02-22 DIAGNOSIS — H25812 Combined forms of age-related cataract, left eye: Secondary | ICD-10-CM | POA: Diagnosis not present

## 2019-03-12 ENCOUNTER — Ambulatory Visit: Payer: PPO | Attending: Internal Medicine

## 2019-03-12 DIAGNOSIS — Z23 Encounter for immunization: Secondary | ICD-10-CM | POA: Insufficient documentation

## 2019-03-12 NOTE — Progress Notes (Signed)
   Covid-19 Vaccination Clinic  Name:  Sheila Humphrey    MRN: [id number] DOB: 1945/11/11  03/12/2019  Sheila Humphrey was observed post Covid-19 immunization for 15 minutes without incidence. She was provided with Vaccine Information Sheet and instruction to access the V-Safe system.   Sheila Humphrey was instructed to call 911 with any severe reactions post vaccine: Marland Kitchen Difficulty breathing  . Swelling of your face and throat  . A fast heartbeat  . A bad rash all over your body  . Dizziness and weakness    Immunizations Administered    Name Date Dose VIS Date Route   Pfizer COVID-19 Vaccine 03/12/2019  9:13 AM 0.3 mL 12/31/2018 Intramuscular   Manufacturer: [employer], [employer]   Lot: [id number]   NDC: [address]-9

## 2019-04-05 ENCOUNTER — Ambulatory Visit: Payer: PPO | Attending: Internal Medicine

## 2019-04-05 DIAGNOSIS — Z23 Encounter for immunization: Secondary | ICD-10-CM

## 2019-04-05 NOTE — Progress Notes (Signed)
   Covid-19 Vaccination Clinic  Name:  Sheila Humphrey    MRN: [id number] DOB: [birth year]  04/05/2019  [name] was observed post Covid-19 immunization for 15 minutes without incident. She was provided with Vaccine Information Sheet and instruction to access the V-Safe system.   [name] was instructed to call 911 with any severe reactions post vaccine: Marland Kitchen Difficulty breathing  . Swelling of face and throat  . A fast heartbeat  . A bad rash all over body  . Dizziness and weakness   Immunizations Administered    Name Date Dose VIS Date Route   Pfizer COVID-19 Vaccine 04/05/2019 12:25 PM 0.3 mL 12/31/2018 Intramuscular   Manufacturer: [employer], [employer]   Lot: [id number]   NDC: [address]-7

## 2019-05-24 DIAGNOSIS — H40022 Open angle with borderline findings, high risk, left eye: Secondary | ICD-10-CM | POA: Diagnosis not present

## 2019-05-24 DIAGNOSIS — H401111 Primary open-angle glaucoma, right eye, mild stage: Secondary | ICD-10-CM | POA: Diagnosis not present

## 2019-06-29 DIAGNOSIS — E782 Mixed hyperlipidemia: Secondary | ICD-10-CM | POA: Diagnosis not present

## 2019-06-29 DIAGNOSIS — J302 Other seasonal allergic rhinitis: Secondary | ICD-10-CM | POA: Diagnosis not present

## 2019-06-29 DIAGNOSIS — Z0189 Encounter for other specified special examinations: Secondary | ICD-10-CM | POA: Diagnosis not present

## 2019-06-29 DIAGNOSIS — Z6841 Body Mass Index (BMI) 40.0 and over, adult: Secondary | ICD-10-CM | POA: Diagnosis not present

## 2019-07-13 DIAGNOSIS — Z0000 Encounter for general adult medical examination without abnormal findings: Secondary | ICD-10-CM | POA: Diagnosis not present

## 2019-07-13 DIAGNOSIS — E785 Hyperlipidemia, unspecified: Secondary | ICD-10-CM | POA: Diagnosis not present

## 2019-07-20 DIAGNOSIS — Z6841 Body Mass Index (BMI) 40.0 and over, adult: Secondary | ICD-10-CM | POA: Diagnosis not present

## 2019-07-20 DIAGNOSIS — J302 Other seasonal allergic rhinitis: Secondary | ICD-10-CM | POA: Diagnosis not present

## 2019-07-20 DIAGNOSIS — E782 Mixed hyperlipidemia: Secondary | ICD-10-CM | POA: Diagnosis not present

## 2019-08-08 ENCOUNTER — Other Ambulatory Visit (HOSPITAL_COMMUNITY): Payer: Self-pay | Admitting: Internal Medicine

## 2019-08-08 DIAGNOSIS — Z1231 Encounter for screening mammogram for malignant neoplasm of breast: Secondary | ICD-10-CM

## 2019-08-10 ENCOUNTER — Ambulatory Visit (HOSPITAL_COMMUNITY)
Admission: RE | Admit: 2019-08-10 | Discharge: 2019-08-10 | Disposition: A | Discharge status: Home / Self Care | Payer: PPO | Source: Ambulatory Visit | Attending: Internal Medicine | Admitting: Internal Medicine

## 2019-08-10 ENCOUNTER — Other Ambulatory Visit: Payer: Self-pay

## 2019-08-10 DIAGNOSIS — Z1231 Encounter for screening mammogram for malignant neoplasm of breast: Secondary | ICD-10-CM | POA: Insufficient documentation

## 2019-09-05 DIAGNOSIS — H40022 Open angle with borderline findings, high risk, left eye: Secondary | ICD-10-CM | POA: Diagnosis not present

## 2019-09-05 DIAGNOSIS — H35033 Hypertensive retinopathy, bilateral: Secondary | ICD-10-CM | POA: Diagnosis not present

## 2019-09-05 DIAGNOSIS — Z961 Presence of intraocular lens: Secondary | ICD-10-CM | POA: Diagnosis not present

## 2019-09-05 DIAGNOSIS — H401111 Primary open-angle glaucoma, right eye, mild stage: Secondary | ICD-10-CM | POA: Diagnosis not present

## 2020-01-12 DIAGNOSIS — Z6841 Body Mass Index (BMI) 40.0 and over, adult: Secondary | ICD-10-CM | POA: Diagnosis not present

## 2020-01-12 DIAGNOSIS — J302 Other seasonal allergic rhinitis: Secondary | ICD-10-CM | POA: Diagnosis not present

## 2020-01-12 DIAGNOSIS — E782 Mixed hyperlipidemia: Secondary | ICD-10-CM | POA: Diagnosis not present

## 2020-01-18 DIAGNOSIS — R945 Abnormal results of liver function studies: Secondary | ICD-10-CM | POA: Diagnosis not present

## 2020-01-18 DIAGNOSIS — E782 Mixed hyperlipidemia: Secondary | ICD-10-CM | POA: Diagnosis not present

## 2020-01-18 DIAGNOSIS — Z0001 Encounter for general adult medical examination with abnormal findings: Secondary | ICD-10-CM | POA: Diagnosis not present

## 2020-01-18 DIAGNOSIS — J302 Other seasonal allergic rhinitis: Secondary | ICD-10-CM | POA: Diagnosis not present

## 2020-01-18 DIAGNOSIS — Z6841 Body Mass Index (BMI) 40.0 and over, adult: Secondary | ICD-10-CM | POA: Diagnosis not present

## 2020-01-18 DIAGNOSIS — H8110 Benign paroxysmal vertigo, unspecified ear: Secondary | ICD-10-CM | POA: Diagnosis not present

## 2020-01-27 ENCOUNTER — Other Ambulatory Visit (HOSPITAL_COMMUNITY): Payer: Self-pay | Admitting: Internal Medicine

## 2020-01-27 DIAGNOSIS — Z13820 Encounter for screening for osteoporosis: Secondary | ICD-10-CM

## 2020-02-07 ENCOUNTER — Other Ambulatory Visit (HOSPITAL_COMMUNITY): Payer: PPO

## 2020-02-16 ENCOUNTER — Ambulatory Visit (HOSPITAL_COMMUNITY)
Admission: RE | Admit: 2020-02-16 | Discharge: 2020-02-16 | Disposition: A | Discharge status: Home / Self Care | Payer: PPO | Source: Ambulatory Visit | Attending: Internal Medicine | Admitting: Internal Medicine

## 2020-02-16 ENCOUNTER — Other Ambulatory Visit: Payer: Self-pay

## 2020-02-16 DIAGNOSIS — Z780 Asymptomatic menopausal state: Secondary | ICD-10-CM | POA: Insufficient documentation

## 2020-02-16 DIAGNOSIS — Z13820 Encounter for screening for osteoporosis: Secondary | ICD-10-CM | POA: Insufficient documentation

## 2020-02-16 DIAGNOSIS — M810 Age-related osteoporosis without current pathological fracture: Secondary | ICD-10-CM | POA: Insufficient documentation

## 2020-02-16 DIAGNOSIS — Z8739 Personal history of other diseases of the musculoskeletal system and connective tissue: Secondary | ICD-10-CM | POA: Diagnosis not present

## 2020-03-08 DIAGNOSIS — H401111 Primary open-angle glaucoma, right eye, mild stage: Secondary | ICD-10-CM | POA: Diagnosis not present

## 2020-03-08 DIAGNOSIS — H40022 Open angle with borderline findings, high risk, left eye: Secondary | ICD-10-CM | POA: Diagnosis not present

## 2020-03-21 NOTE — Discharge Instructions (Signed)
Denosumab injection What is this medicine? DENOSUMAB (den oh sue mab) slows bone breakdown. Prolia is used to treat osteoporosis in women after menopause and in men, and in people who are taking corticosteroids for 6 months or more. Xgeva is used to treat a high calcium level due to cancer and to prevent bone fractures and other bone problems caused by multiple myeloma or cancer bone metastases. Xgeva is also used to treat giant cell tumor of the bone. This medicine may be used for other purposes; ask your health care provider or pharmacist if you have questions. COMMON BRAND NAME(S): Prolia, XGEVA What should I tell my health care provider before I take this medicine? They need to know if you have any of these conditions:  dental disease  having surgery or tooth extraction  infection  kidney disease  low levels of calcium or Vitamin D in the blood  malnutrition  on hemodialysis  skin conditions or sensitivity  thyroid or parathyroid disease  an unusual reaction to denosumab, other medicines, foods, dyes, or preservatives  pregnant or trying to get pregnant  breast-feeding How should I use this medicine? This medicine is for injection under the skin. It is given by a health care professional in a hospital or clinic setting. A special MedGuide will be given to you before each treatment. Be sure to read this information carefully each time. For Prolia, talk to your pediatrician regarding the use of this medicine in children. Special care may be needed. For Xgeva, talk to your pediatrician regarding the use of this medicine in children. While this drug may be prescribed for children as young as 13 years for selected conditions, precautions do apply. Overdosage: If you think you have taken too much of this medicine contact a poison control center or emergency room at once. NOTE: This medicine is only for you. Do not share this medicine with others. What if I miss a dose? It is  important not to miss your dose. Call your doctor or health care professional if you are unable to keep an appointment. What may interact with this medicine? Do not take this medicine with any of the following medications:  other medicines containing denosumab This medicine may also interact with the following medications:  medicines that lower your chance of fighting infection  steroid medicines like prednisone or cortisone This list may not describe all possible interactions. Give your health care provider a list of all the medicines, herbs, non-prescription drugs, or dietary supplements you use. Also tell them if you smoke, drink alcohol, or use illegal drugs. Some items may interact with your medicine. What should I watch for while using this medicine? Visit your doctor or health care professional for regular checks on your progress. Your doctor or health care professional may order blood tests and other tests to see how you are doing. Call your doctor or health care professional for advice if you get a fever, chills or sore throat, or other symptoms of a cold or flu. Do not treat yourself. This drug may decrease your body's ability to fight infection. Try to avoid being around people who are sick. You should make sure you get enough calcium and vitamin D while you are taking this medicine, unless your doctor tells you not to. Discuss the foods you eat and the vitamins you take with your health care professional. See your dentist regularly. Brush and floss your teeth as directed. Before you have any dental work done, tell your dentist you are   receiving this medicine. Do not become pregnant while taking this medicine or for 5 months after stopping it. Talk with your doctor or health care professional about your birth control options while taking this medicine. Women should inform their doctor if they wish to become pregnant or think they might be pregnant. There is a potential for serious side  effects to an unborn child. Talk to your health care professional or pharmacist for more information. What side effects may I notice from receiving this medicine? Side effects that you should report to your doctor or health care professional as soon as possible:  allergic reactions like skin rash, itching or hives, swelling of the face, lips, or tongue  bone pain  breathing problems  dizziness  jaw pain, especially after dental work  redness, blistering, peeling of the skin  signs and symptoms of infection like fever or chills; cough; sore throat; pain or trouble passing urine  signs of low calcium like fast heartbeat, muscle cramps or muscle pain; pain, tingling, numbness in the hands or feet; seizures  unusual bleeding or bruising  unusually weak or tired Side effects that usually do not require medical attention (report to your doctor or health care professional if they continue or are bothersome):  constipation  diarrhea  headache  joint pain  loss of appetite  muscle pain  runny nose  tiredness  upset stomach This list may not describe all possible side effects. Call your doctor for medical advice about side effects. You may report side effects to FDA at 1-800-FDA-1088. Where should I keep my medicine? This medicine is only given in a clinic, doctor's office, or other health care setting and will not be stored at home. NOTE: This sheet is a summary. It may not cover all possible information. If you have questions about this medicine, talk to your doctor, pharmacist, or health care provider.  2021 [employer]vier/Gold Standard (2017-05-15 16:10:44)

## 2020-03-26 ENCOUNTER — Other Ambulatory Visit: Payer: Self-pay

## 2020-03-26 ENCOUNTER — Encounter (HOSPITAL_COMMUNITY): Payer: Self-pay

## 2020-03-26 ENCOUNTER — Encounter (HOSPITAL_COMMUNITY)
Admission: RE | Admit: 2020-03-26 | Discharge: 2020-03-26 | Disposition: A | Discharge status: Home / Self Care | Payer: PPO | Source: Ambulatory Visit | Attending: Internal Medicine | Admitting: Internal Medicine

## 2020-03-26 DIAGNOSIS — M810 Age-related osteoporosis without current pathological fracture: Secondary | ICD-10-CM | POA: Insufficient documentation

## 2020-03-26 HISTORY — DX: Pure hypercholesterolemia, unspecified: E78.00

## 2020-03-26 HISTORY — DX: Age-related osteoporosis without current pathological fracture: M81.0

## 2020-03-26 MED ORDER — DENOSUMAB 60 MG/ML ~~LOC~~ SOSY
60.0000 mg | PREFILLED_SYRINGE | Freq: Once | SUBCUTANEOUS | Status: AC
  Administered 2020-03-26: 60 mg via SUBCUTANEOUS

## 2020-04-13 DIAGNOSIS — H401111 Primary open-angle glaucoma, right eye, mild stage: Secondary | ICD-10-CM | POA: Diagnosis not present

## 2020-05-18 DIAGNOSIS — H401111 Primary open-angle glaucoma, right eye, mild stage: Secondary | ICD-10-CM | POA: Diagnosis not present

## 2020-05-18 DIAGNOSIS — H40022 Open angle with borderline findings, high risk, left eye: Secondary | ICD-10-CM | POA: Diagnosis not present

## 2020-06-22 DIAGNOSIS — H40022 Open angle with borderline findings, high risk, left eye: Secondary | ICD-10-CM | POA: Diagnosis not present

## 2020-06-22 DIAGNOSIS — H401111 Primary open-angle glaucoma, right eye, mild stage: Secondary | ICD-10-CM | POA: Diagnosis not present

## 2020-07-04 ENCOUNTER — Other Ambulatory Visit (HOSPITAL_COMMUNITY): Payer: Self-pay | Admitting: Internal Medicine

## 2020-07-04 DIAGNOSIS — Z1231 Encounter for screening mammogram for malignant neoplasm of breast: Secondary | ICD-10-CM

## 2020-07-05 DIAGNOSIS — R945 Abnormal results of liver function studies: Secondary | ICD-10-CM | POA: Diagnosis not present

## 2020-07-05 DIAGNOSIS — Z79899 Other long term (current) drug therapy: Secondary | ICD-10-CM | POA: Diagnosis not present

## 2020-07-11 DIAGNOSIS — M810 Age-related osteoporosis without current pathological fracture: Secondary | ICD-10-CM | POA: Diagnosis not present

## 2020-07-11 DIAGNOSIS — R42 Dizziness and giddiness: Secondary | ICD-10-CM | POA: Diagnosis not present

## 2020-07-11 DIAGNOSIS — J302 Other seasonal allergic rhinitis: Secondary | ICD-10-CM | POA: Diagnosis not present

## 2020-07-11 DIAGNOSIS — Z23 Encounter for immunization: Secondary | ICD-10-CM | POA: Diagnosis not present

## 2020-07-11 DIAGNOSIS — R945 Abnormal results of liver function studies: Secondary | ICD-10-CM | POA: Diagnosis not present

## 2020-07-11 DIAGNOSIS — E782 Mixed hyperlipidemia: Secondary | ICD-10-CM | POA: Diagnosis not present

## 2020-07-11 DIAGNOSIS — N1831 Chronic kidney disease, stage 3a: Secondary | ICD-10-CM | POA: Diagnosis not present

## 2020-08-13 ENCOUNTER — Ambulatory Visit (HOSPITAL_COMMUNITY)
Admission: RE | Admit: 2020-08-13 | Discharge: 2020-08-13 | Disposition: A | Discharge status: Home / Self Care | Payer: PPO | Source: Ambulatory Visit | Attending: Internal Medicine | Admitting: Internal Medicine

## 2020-08-13 ENCOUNTER — Other Ambulatory Visit: Payer: Self-pay

## 2020-08-13 DIAGNOSIS — Z1231 Encounter for screening mammogram for malignant neoplasm of breast: Secondary | ICD-10-CM | POA: Diagnosis not present

## 2020-08-31 DIAGNOSIS — H524 Presbyopia: Secondary | ICD-10-CM | POA: Diagnosis not present

## 2020-08-31 DIAGNOSIS — H34831 Tributary (branch) retinal vein occlusion, right eye, with macular edema: Secondary | ICD-10-CM | POA: Diagnosis not present

## 2020-08-31 DIAGNOSIS — H35033 Hypertensive retinopathy, bilateral: Secondary | ICD-10-CM | POA: Diagnosis not present

## 2020-08-31 DIAGNOSIS — H40022 Open angle with borderline findings, high risk, left eye: Secondary | ICD-10-CM | POA: Diagnosis not present

## 2020-08-31 DIAGNOSIS — H401111 Primary open-angle glaucoma, right eye, mild stage: Secondary | ICD-10-CM | POA: Diagnosis not present

## 2020-09-26 ENCOUNTER — Encounter (HOSPITAL_COMMUNITY)
Admission: RE | Admit: 2020-09-26 | Discharge: 2020-09-26 | Disposition: A | Discharge status: Home / Self Care | Payer: PPO | Source: Ambulatory Visit | Attending: Internal Medicine | Admitting: Internal Medicine

## 2020-09-26 ENCOUNTER — Other Ambulatory Visit: Payer: Self-pay

## 2020-09-26 DIAGNOSIS — M810 Age-related osteoporosis without current pathological fracture: Secondary | ICD-10-CM | POA: Insufficient documentation

## 2020-09-26 MED ORDER — DENOSUMAB 60 MG/ML ~~LOC~~ SOSY
PREFILLED_SYRINGE | SUBCUTANEOUS | Status: AC
  Administered 2020-09-26: 60 mg via SUBCUTANEOUS
  Filled 2020-09-26: qty 1

## 2020-09-26 MED ORDER — DENOSUMAB 60 MG/ML ~~LOC~~ SOSY: 60.0000 mg | PREFILLED_SYRINGE | Freq: Once | SUBCUTANEOUS | Status: AC

## 2021-01-16 DIAGNOSIS — M810 Age-related osteoporosis without current pathological fracture: Secondary | ICD-10-CM | POA: Diagnosis not present

## 2021-01-16 DIAGNOSIS — E782 Mixed hyperlipidemia: Secondary | ICD-10-CM | POA: Diagnosis not present

## 2021-01-24 DIAGNOSIS — M810 Age-related osteoporosis without current pathological fracture: Secondary | ICD-10-CM | POA: Diagnosis not present

## 2021-01-24 DIAGNOSIS — R945 Abnormal results of liver function studies: Secondary | ICD-10-CM | POA: Diagnosis not present

## 2021-01-24 DIAGNOSIS — E782 Mixed hyperlipidemia: Secondary | ICD-10-CM | POA: Diagnosis not present

## 2021-01-24 DIAGNOSIS — N1831 Chronic kidney disease, stage 3a: Secondary | ICD-10-CM | POA: Diagnosis not present

## 2021-01-24 DIAGNOSIS — R42 Dizziness and giddiness: Secondary | ICD-10-CM | POA: Diagnosis not present

## 2021-01-24 DIAGNOSIS — Z0001 Encounter for general adult medical examination with abnormal findings: Secondary | ICD-10-CM | POA: Diagnosis not present

## 2021-03-07 DIAGNOSIS — H401111 Primary open-angle glaucoma, right eye, mild stage: Secondary | ICD-10-CM | POA: Diagnosis not present

## 2021-03-07 DIAGNOSIS — H40022 Open angle with borderline findings, high risk, left eye: Secondary | ICD-10-CM | POA: Diagnosis not present

## 2021-03-26 ENCOUNTER — Encounter (HOSPITAL_COMMUNITY)
Admission: RE | Admit: 2021-03-26 | Discharge: 2021-03-26 | Disposition: A | Discharge status: Home / Self Care | Payer: PPO | Source: Ambulatory Visit | Attending: Internal Medicine | Admitting: Internal Medicine

## 2021-03-26 ENCOUNTER — Other Ambulatory Visit: Payer: Self-pay

## 2021-03-26 DIAGNOSIS — M810 Age-related osteoporosis without current pathological fracture: Secondary | ICD-10-CM | POA: Insufficient documentation

## 2021-03-26 MED ORDER — DENOSUMAB 60 MG/ML ~~LOC~~ SOSY: 60.0000 mg | PREFILLED_SYRINGE | Freq: Once | SUBCUTANEOUS | Status: AC

## 2021-03-26 MED ORDER — DENOSUMAB 60 MG/ML ~~LOC~~ SOSY
PREFILLED_SYRINGE | SUBCUTANEOUS | Status: AC
  Administered 2021-03-26: 60 mg via SUBCUTANEOUS
  Filled 2021-03-26: qty 1

## 2021-07-08 ENCOUNTER — Other Ambulatory Visit (HOSPITAL_COMMUNITY): Payer: Self-pay | Admitting: Internal Medicine

## 2021-07-08 DIAGNOSIS — Z1231 Encounter for screening mammogram for malignant neoplasm of breast: Secondary | ICD-10-CM

## 2021-07-18 DIAGNOSIS — E782 Mixed hyperlipidemia: Secondary | ICD-10-CM | POA: Diagnosis not present

## 2021-07-18 DIAGNOSIS — N1831 Chronic kidney disease, stage 3a: Secondary | ICD-10-CM | POA: Diagnosis not present

## 2021-07-24 DIAGNOSIS — E782 Mixed hyperlipidemia: Secondary | ICD-10-CM | POA: Diagnosis not present

## 2021-07-24 DIAGNOSIS — Z1211 Encounter for screening for malignant neoplasm of colon: Secondary | ICD-10-CM | POA: Diagnosis not present

## 2021-07-24 DIAGNOSIS — Z6835 Body mass index (BMI) 35.0-35.9, adult: Secondary | ICD-10-CM | POA: Diagnosis not present

## 2021-07-24 DIAGNOSIS — R945 Abnormal results of liver function studies: Secondary | ICD-10-CM | POA: Diagnosis not present

## 2021-07-24 DIAGNOSIS — N1831 Chronic kidney disease, stage 3a: Secondary | ICD-10-CM | POA: Diagnosis not present

## 2021-07-24 DIAGNOSIS — E6609 Other obesity due to excess calories: Secondary | ICD-10-CM | POA: Diagnosis not present

## 2021-07-24 DIAGNOSIS — M810 Age-related osteoporosis without current pathological fracture: Secondary | ICD-10-CM | POA: Diagnosis not present

## 2021-07-29 DIAGNOSIS — Z1211 Encounter for screening for malignant neoplasm of colon: Secondary | ICD-10-CM | POA: Diagnosis not present

## 2021-08-06 LAB — COLOGUARD: COLOGUARD: NEGATIVE

## 2021-08-06 LAB — EXTERNAL GENERIC LAB PROCEDURE: COLOGUARD: NEGATIVE

## 2021-08-15 ENCOUNTER — Ambulatory Visit (HOSPITAL_COMMUNITY)
Admission: RE | Admit: 2021-08-15 | Discharge: 2021-08-15 | Disposition: A | Discharge status: Home / Self Care | Payer: PPO | Source: Ambulatory Visit | Attending: Internal Medicine | Admitting: Internal Medicine

## 2021-08-15 DIAGNOSIS — Z1231 Encounter for screening mammogram for malignant neoplasm of breast: Secondary | ICD-10-CM | POA: Diagnosis not present

## 2021-09-17 IMAGING — MG MM DIGITAL SCREENING BILAT W/ TOMO AND CAD
8 series · 8 of 24 positions shown · non-contrast
Comparison: Previous exam(s).

CLINICAL DATA: Screening.

EXAM:
DIGITAL SCREENING BILATERAL MAMMOGRAM WITH TOMOSYNTHESIS AND CAD
TECHNIQUE: Bilateral screening digital craniocaudal and mediolateral oblique
mammograms were obtained. Bilateral screening digital breast
tomosynthesis was performed. The images were evaluated with
computer-aided detection.

[L CC synth-2D]
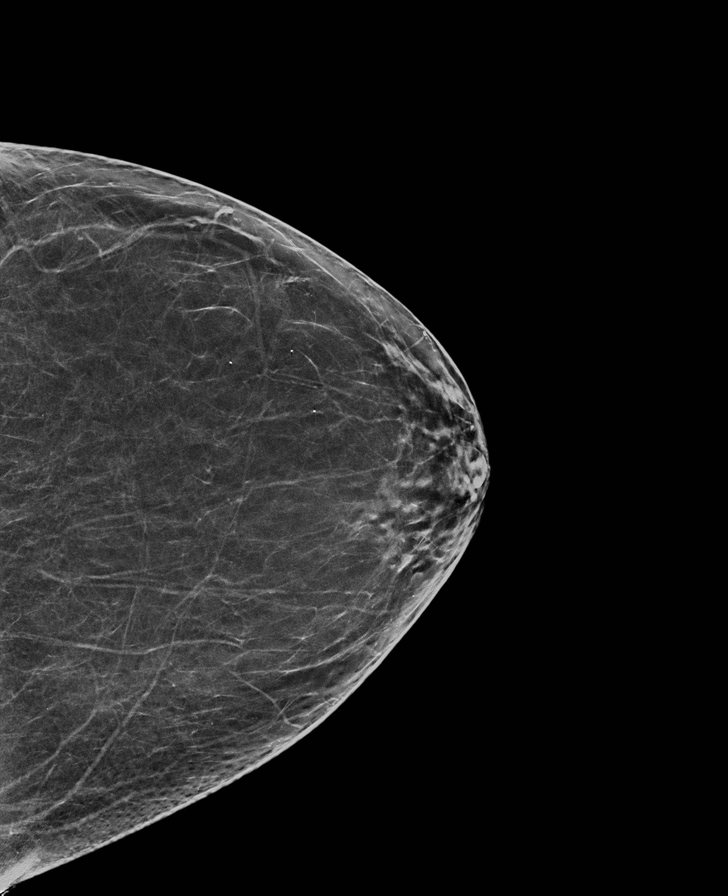

[R CC synth-2D]
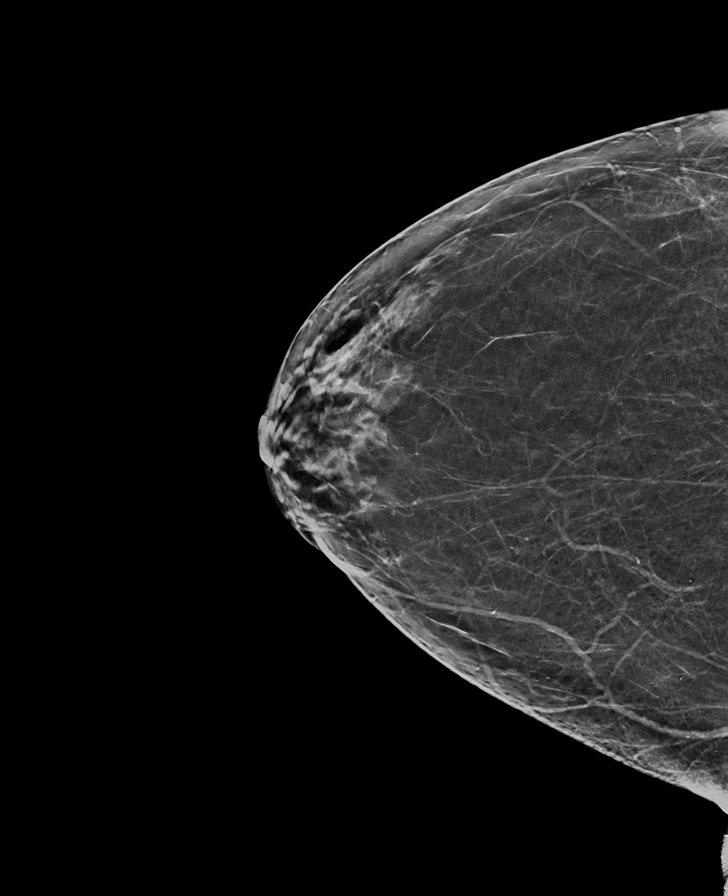

[R MLO synth-2D]
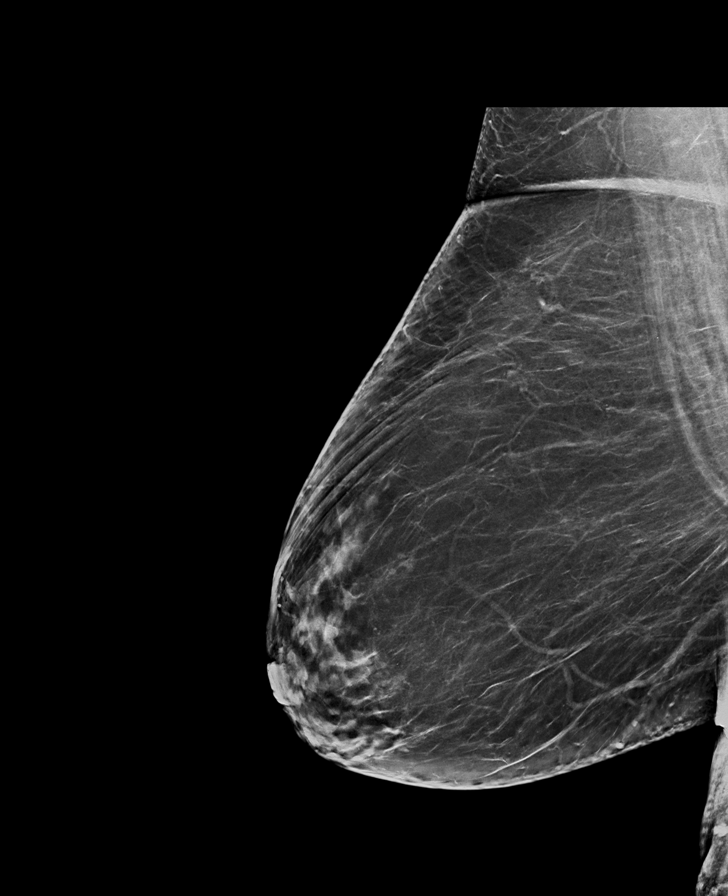

[L MLO synth-2D]
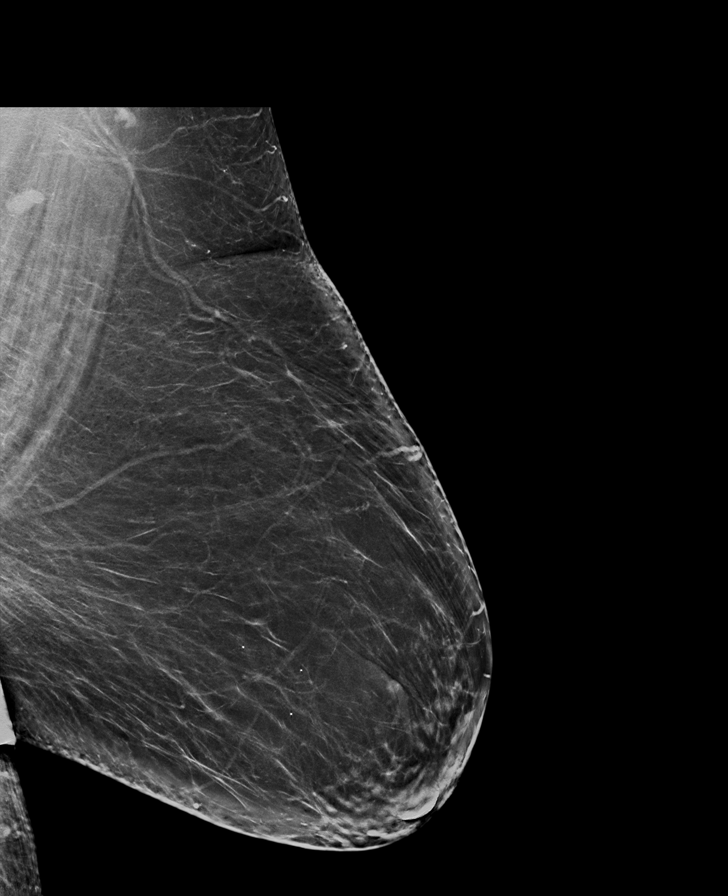

[R MLO tomo · tomo slice 41/80.0]
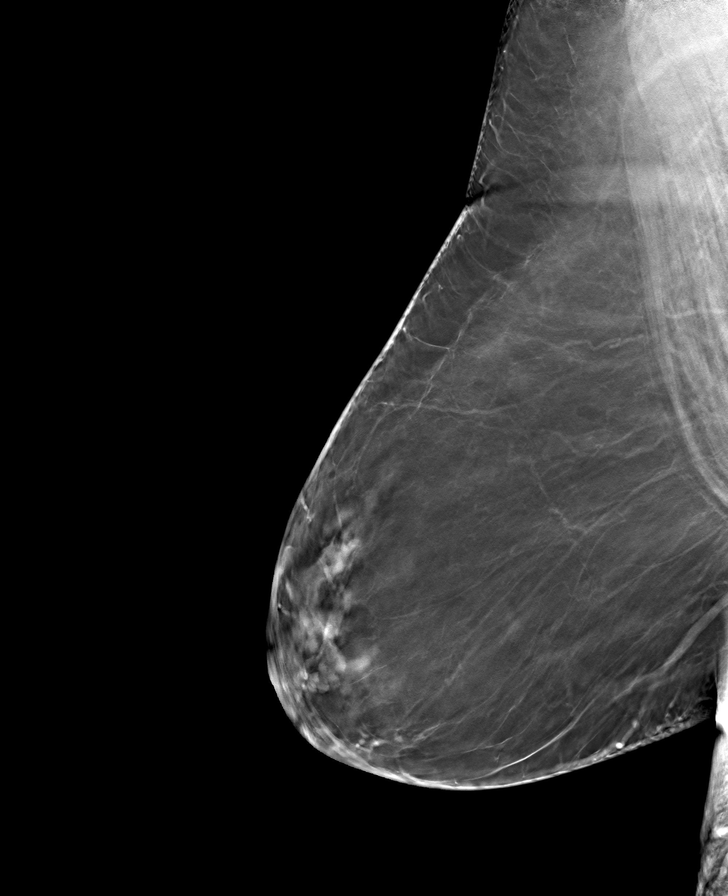

[L MLO tomo · tomo slice 45/89.0]
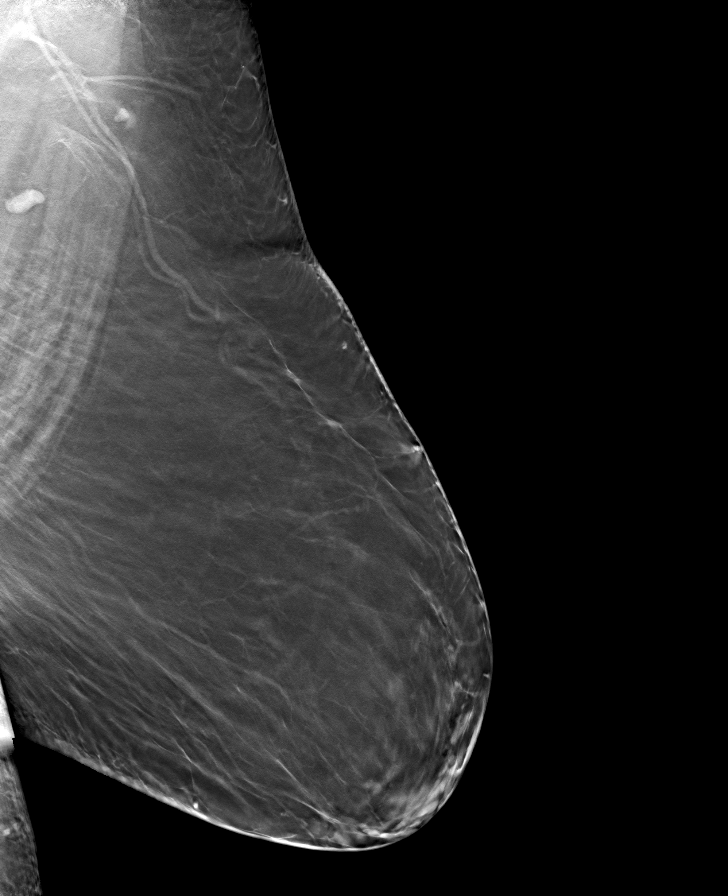

[L CC tomo · tomo slice 35/69.0]
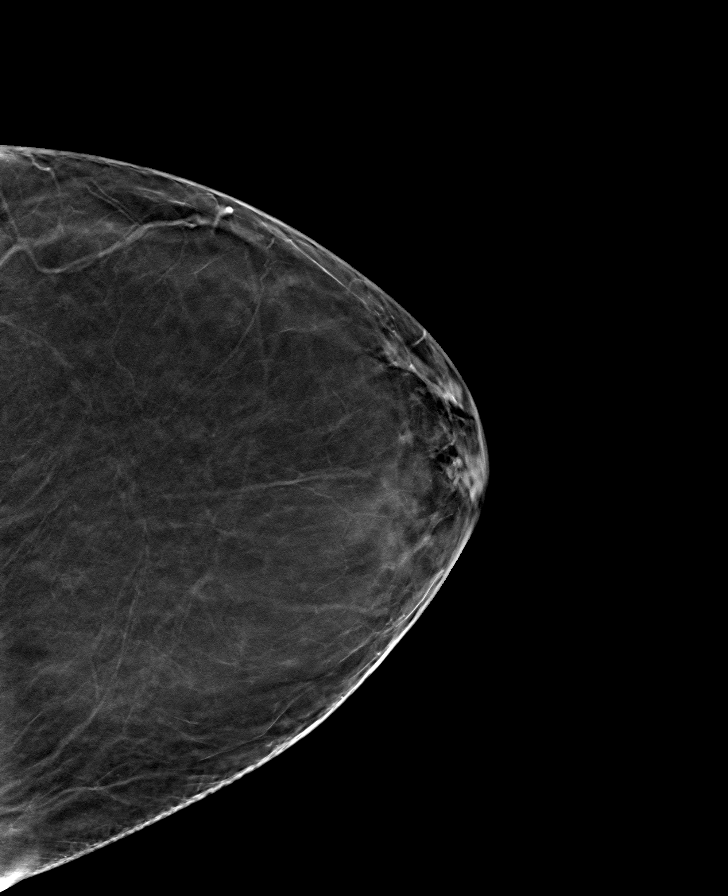

[R CC tomo · tomo slice 33/65.0]
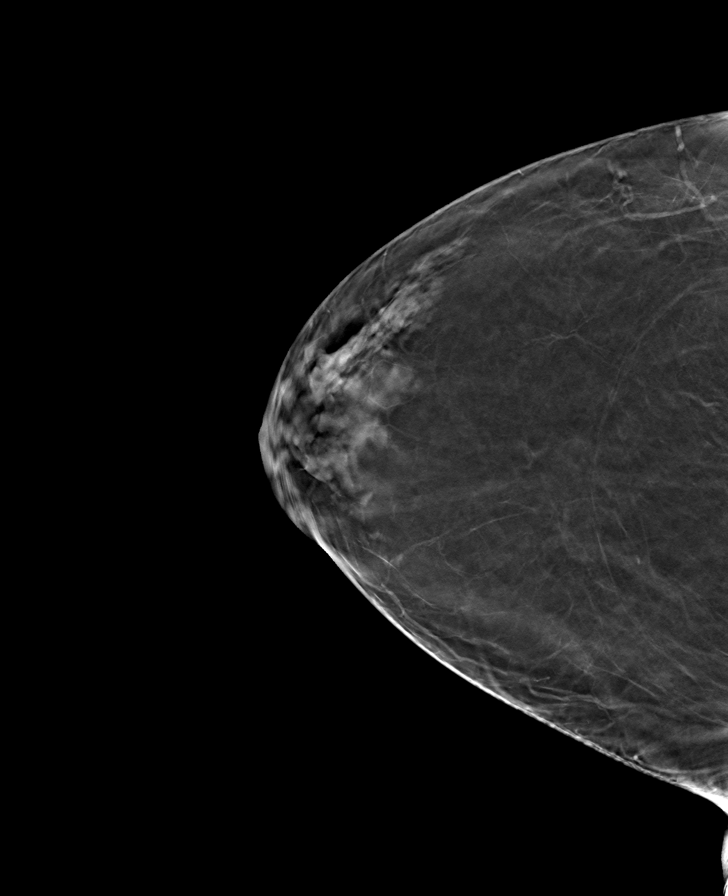

[8 of 24 positions shown; findings below may reference images not displayed]

ACR Breast Density Category b: There are scattered areas of
fibroglandular density.
FINDINGS: There are no findings suspicious for malignancy.
IMPRESSION: No mammographic evidence of malignancy. A result letter of this
screening mammogram will be mailed directly to the patient.

RECOMMENDATION:
Screening mammogram in one year. (Code:51-O-LD2)

BI-RADS CATEGORY  1: Negative.

## 2021-09-19 DIAGNOSIS — H348311 Tributary (branch) retinal vein occlusion, right eye, with retinal neovascularization: Secondary | ICD-10-CM | POA: Diagnosis not present

## 2021-09-19 DIAGNOSIS — H35033 Hypertensive retinopathy, bilateral: Secondary | ICD-10-CM | POA: Diagnosis not present

## 2021-09-19 DIAGNOSIS — H40022 Open angle with borderline findings, high risk, left eye: Secondary | ICD-10-CM | POA: Diagnosis not present

## 2021-09-19 DIAGNOSIS — H401111 Primary open-angle glaucoma, right eye, mild stage: Secondary | ICD-10-CM | POA: Diagnosis not present

## 2021-09-26 ENCOUNTER — Encounter (HOSPITAL_COMMUNITY): Payer: PPO

## 2021-09-26 ENCOUNTER — Encounter (HOSPITAL_COMMUNITY)
Admission: RE | Admit: 2021-09-26 | Discharge: 2021-09-26 | Disposition: A | Discharge status: Home / Self Care | Payer: PPO | Source: Ambulatory Visit | Attending: Internal Medicine | Admitting: Internal Medicine

## 2021-09-26 VITALS — BP 137/71 | HR 84 | Temp 98.0°F | Resp 18

## 2021-09-26 DIAGNOSIS — M810 Age-related osteoporosis without current pathological fracture: Secondary | ICD-10-CM | POA: Insufficient documentation

## 2021-09-26 MED ORDER — DENOSUMAB 60 MG/ML ~~LOC~~ SOSY
60.0000 mg | PREFILLED_SYRINGE | Freq: Once | SUBCUTANEOUS | Status: AC
  Administered 2021-09-26: 60 mg via SUBCUTANEOUS

## 2021-09-26 NOTE — Progress Notes (Signed)
Diagnosis: Osteoporosis  Provider:  Zack Hall MD  Procedure: Injection  Prolia (Denosumab), Dose: 60 mg, Site: subcutaneous, Number of injections: 1  Discharge: Condition: Good, Destination: Home . AVS provided to patient.   Performed by:  Azara Gemme, RN        

## 2022-01-17 DIAGNOSIS — N1831 Chronic kidney disease, stage 3a: Secondary | ICD-10-CM | POA: Diagnosis not present

## 2022-01-17 DIAGNOSIS — M810 Age-related osteoporosis without current pathological fracture: Secondary | ICD-10-CM | POA: Diagnosis not present

## 2022-01-23 DIAGNOSIS — N1831 Chronic kidney disease, stage 3a: Secondary | ICD-10-CM | POA: Diagnosis not present

## 2022-01-23 DIAGNOSIS — E782 Mixed hyperlipidemia: Secondary | ICD-10-CM | POA: Diagnosis not present

## 2022-01-23 DIAGNOSIS — Z1211 Encounter for screening for malignant neoplasm of colon: Secondary | ICD-10-CM | POA: Diagnosis not present

## 2022-01-23 DIAGNOSIS — R945 Abnormal results of liver function studies: Secondary | ICD-10-CM | POA: Diagnosis not present

## 2022-01-23 DIAGNOSIS — M810 Age-related osteoporosis without current pathological fracture: Secondary | ICD-10-CM | POA: Diagnosis not present

## 2022-01-23 DIAGNOSIS — Z0000 Encounter for general adult medical examination without abnormal findings: Secondary | ICD-10-CM | POA: Diagnosis not present

## 2022-01-23 DIAGNOSIS — Z2821 Immunization not carried out because of patient refusal: Secondary | ICD-10-CM | POA: Diagnosis not present

## 2022-02-12 DIAGNOSIS — E669 Obesity, unspecified: Secondary | ICD-10-CM | POA: Diagnosis not present

## 2022-02-12 DIAGNOSIS — R079 Chest pain, unspecified: Secondary | ICD-10-CM | POA: Diagnosis not present

## 2022-02-12 DIAGNOSIS — Z6834 Body mass index (BMI) 34.0-34.9, adult: Secondary | ICD-10-CM | POA: Diagnosis not present

## 2022-02-12 DIAGNOSIS — R030 Elevated blood-pressure reading, without diagnosis of hypertension: Secondary | ICD-10-CM | POA: Diagnosis not present

## 2022-02-12 DIAGNOSIS — M25512 Pain in left shoulder: Secondary | ICD-10-CM | POA: Diagnosis not present

## 2022-02-20 ENCOUNTER — Other Ambulatory Visit: Payer: Self-pay

## 2022-03-06 ENCOUNTER — Telehealth: Payer: Self-pay | Admitting: Pharmacy Technician

## 2022-03-06 NOTE — Telephone Encounter (Signed)
Auth Submission: APPROVED @ [facility] No auth is required, but submitted to require predetermination. Payer: [employer] Medication & CPT/J Code(s) submitted: Prolia (Denosumab) [id number] Route of submission (phone, fax, portal):  Phone # [phone] Fax # [phone] Auth type: Buy/Bill Units/visits requested: 1 Reference number: [id number] Approval from: 02/24/22 to 05/25/22

## 2022-03-18 DIAGNOSIS — H401111 Primary open-angle glaucoma, right eye, mild stage: Secondary | ICD-10-CM | POA: Diagnosis not present

## 2022-03-18 DIAGNOSIS — H40022 Open angle with borderline findings, high risk, left eye: Secondary | ICD-10-CM | POA: Diagnosis not present

## 2022-03-27 ENCOUNTER — Encounter (HOSPITAL_COMMUNITY)
Admission: RE | Admit: 2022-03-27 | Discharge: 2022-03-27 | Disposition: A | Discharge status: Home / Self Care | Payer: Medicare HMO | Source: Ambulatory Visit | Attending: Internal Medicine | Admitting: Internal Medicine

## 2022-03-27 ENCOUNTER — Encounter (HOSPITAL_COMMUNITY): Payer: Self-pay | Admitting: Internal Medicine

## 2022-03-27 VITALS — BP 122/83 | HR 96 | Temp 98.6°F | Resp 20

## 2022-03-27 DIAGNOSIS — M810 Age-related osteoporosis without current pathological fracture: Secondary | ICD-10-CM | POA: Insufficient documentation

## 2022-03-27 MED ORDER — DENOSUMAB 60 MG/ML ~~LOC~~ SOSY
60.0000 mg | PREFILLED_SYRINGE | Freq: Once | SUBCUTANEOUS | Status: AC
  Administered 2022-03-27: 60 mg via SUBCUTANEOUS

## 2022-03-27 NOTE — Progress Notes (Signed)
Diagnosis: Osteoporosis  Provider:  Wende Neighbors MD  Procedure: Injection  Prolia (Denosumab), Dose: 60 mg, Site: subcutaneous, Number of injections: 1  Administered in left arm.  Post Care: Patient declined observation  Discharge: Condition: Good, Destination: Home . AVS Provided  Performed by:  Binnie Kand, RN

## 2022-03-27 NOTE — Addendum Note (Signed)
Encounter addended by: Baxter Hire, RN on: 03/27/2022 3:11 PM  Actions taken: Therapy plan modified

## 2022-04-16 DIAGNOSIS — H40022 Open angle with borderline findings, high risk, left eye: Secondary | ICD-10-CM | POA: Diagnosis not present

## 2022-04-16 DIAGNOSIS — H401111 Primary open-angle glaucoma, right eye, mild stage: Secondary | ICD-10-CM | POA: Diagnosis not present

## 2022-05-21 DIAGNOSIS — H401111 Primary open-angle glaucoma, right eye, mild stage: Secondary | ICD-10-CM | POA: Diagnosis not present

## 2022-05-21 DIAGNOSIS — H40022 Open angle with borderline findings, high risk, left eye: Secondary | ICD-10-CM | POA: Diagnosis not present

## 2022-06-23 ENCOUNTER — Ambulatory Visit
Admission: EM | Admit: 2022-06-23 | Discharge: 2022-06-23 | Disposition: A | Discharge status: Home / Self Care | Payer: PPO | Attending: Nurse Practitioner | Admitting: Nurse Practitioner

## 2022-06-23 ENCOUNTER — Encounter (HOSPITAL_COMMUNITY): Payer: Self-pay | Admitting: Internal Medicine

## 2022-06-23 ENCOUNTER — Ambulatory Visit (INDEPENDENT_AMBULATORY_CARE_PROVIDER_SITE_OTHER): Payer: PPO

## 2022-06-23 DIAGNOSIS — M546 Pain in thoracic spine: Secondary | ICD-10-CM

## 2022-06-23 MED ORDER — DEXAMETHASONE SODIUM PHOSPHATE 10 MG/ML IJ SOLN
10.0000 mg | INTRAMUSCULAR | Status: AC
  Administered 2022-06-23: 10 mg via INTRAMUSCULAR

## 2022-06-23 MED ORDER — TIZANIDINE HCL 2 MG PO TABS: 2.0000 mg | ORAL_TABLET | Freq: Two times a day (BID) | ORAL | 0 refills | Status: AC | PRN

## 2022-06-23 MED ORDER — TIZANIDINE HCL 2 MG PO CAPS: 2.0000 mg | ORAL_CAPSULE | Freq: Two times a day (BID) | ORAL | 0 refills | Status: DC

## 2022-06-23 NOTE — Discharge Instructions (Addendum)
The x-ray is negative for fracture or dislocation; however, it does show degenerative changes in your mid back, which may be causing you to have back pain. Take medication as prescribed. Recommend Tylenol arthritis strength 650 mg tablets, take 1 tablet every 8 hours as needed for back pain or discomfort. May apply ice or heat as needed.  Apply ice for pain or swelling, heat for spasm or stiffness.  Apply for 20 minutes, remove for 1 hour, then repeat as needed. Try to remain as active as possible. If your symptoms continue to recur, or fail to improve, it is recommended that you follow-up with orthopedics for further evaluation. Go to the emergency department immediately if you become unable to walk, have numbness or tingling in your hands or feet, or develop difficulty breathing. Follow-up as needed.

## 2022-06-23 NOTE — ED Triage Notes (Signed)
Pt reports she she woke up with mid to low back pain x 2 days. States the lower left side is more painful. Pt denies injury. Took motrin which didn't provide any relief. States she has a spasm like pain when she goes to reach her arm out.

## 2022-06-23 NOTE — ED Provider Notes (Signed)
[facility]    CSN: [id number] Arrival date & time: 06/23/22  1101      History   Chief Complaint No chief complaint on file.   HPI Sheila Humphrey is a 76 y.o. female.   The history is provided by the patient.   The patient presents for complaints of mid back pain.  Symptoms started over the past 2 days.  Patient denies injury, trauma, numbness, or tingling.  Patient states that she feels that the pain wraps around her chest and abdomen.  Patient reports that going from a sitting to a standing position makes her symptoms worse along with reaching her arms out.  She states that she experienced the same or similar symptoms back in January.  Patient states that she does have a history of osteoporosis.  She reports she has been taking ibuprofen for her symptoms with minimal relief.  Past Medical History:  Diagnosis Date   Hypercholesteremia    Osteoporosis     Patient Active Problem List   Diagnosis Date Noted   Osteoporosis     Past Surgical History:  Procedure Laterality Date   CHOLECYSTECTOMY     EYE SURGERY Bilateral 12/12020   Cataract extraction    OB History   No obstetric history on file.      Home Medications    Prior to Admission medications   Medication Sig Start Date End Date Taking? Authorizing Provider  tiZANidine (ZANAFLEX) 2 MG tablet Take 1 tablet (2 mg total) by mouth 2 (two) times daily as needed for muscle spasms. 06/23/22  Yes Melah Ebling-Warren, Sadie Haber, NP  atorvastatin (LIPITOR) 20 MG tablet Take 20 mg by mouth daily.    [provider]  Cholecalciferol (VITAMIN D) 50 MCG (2000 UT) CAPS Take 2,000 Units by mouth.    Benita Stabile, MD  denosumab (PROLIA) 60 MG/ML SOSY injection Inject 60 mg into the skin every 6 (six) months. 03/26/20   Benita Stabile, MD  medroxyPROGESTERone (PROVERA) 10 MG tablet TAKE ONE TABLET BY MOUTH ONCE DAILY    Lazaro Arms, MD  Olopatadine HCl 0.2 % SOLN Apply 1 drop to eye daily.    [provider]    Family History History reviewed. No pertinent family history.  Social History Social History   Tobacco Use   Smoking status: Never   Smokeless tobacco: Never  Vaping Use   Vaping Use: Never used  Substance Use Topics   Alcohol use: No   Drug use: No     Allergies   Cefuroxime axetil and Other   Review of Systems Review of Systems Per HPI  Physical Exam Triage Vital Signs ED Triage Vitals  Enc Vitals Group     BP 06/23/22 1226 (!) 149/71     Pulse Rate 06/23/22 1226 76     Resp 06/23/22 1226 16     Temp 06/23/22 1226 98.5 F (36.9 C)     Temp Source 06/23/22 1226 Oral     SpO2 06/23/22 1226 97 %     Weight --      Height --      Head Circumference --      Peak Flow --      Pain Score 06/23/22 1227 10     Pain Loc --      Pain Edu? --      Excl. in GC? --    No data found.  Updated Vital Signs BP (!) 149/71 (BP Location: Right Arm)  Pulse 76   Temp 98.5 F (36.9 C) (Oral)   Resp 16   SpO2 97%   Visual Acuity Right Eye Distance:   Left Eye Distance:   Bilateral Distance:    Right Eye Near:   Left Eye Near:    Bilateral Near:     Physical Exam Vitals and nursing note reviewed.  Constitutional:      General: She is not in acute distress.    Appearance: Normal appearance.  HENT:     Head: Normocephalic.  Eyes:     Extraocular Movements: Extraocular movements intact.     Pupils: Pupils are equal, round, and reactive to light.  Cardiovascular:     Rate and Rhythm: Normal rate and regular rhythm.     Pulses: Normal pulses.     Heart sounds: Normal heart sounds.  Pulmonary:     Effort: Pulmonary effort is normal. No respiratory distress.     Breath sounds: Normal breath sounds. No stridor. No wheezing, rhonchi or rales.  Abdominal:     General: Bowel sounds are normal.     Palpations: Abdomen is soft.     Tenderness: There is no abdominal tenderness.  Musculoskeletal:     Cervical back: Normal range of motion.      Thoracic back: Spasms and tenderness (Tenderness noted from T10 through T7) present. No swelling, edema or signs of trauma. Decreased range of motion.  Lymphadenopathy:     Cervical: No cervical adenopathy.  Skin:    General: Skin is warm and dry.  Neurological:     General: No focal deficit present.     Mental Status: She is alert and oriented to person, place, and time.  Psychiatric:        Mood and Affect: Mood normal.        Behavior: Behavior normal.      UC Treatments / Results  Labs (all labs ordered are listed, but only abnormal results are displayed) Labs Reviewed - No data to display  EKG   Radiology DG Thoracic Spine 2 View  Result Date: 06/23/2022 CLINICAL DATA:  Back pain x2 days EXAM: THORACIC SPINE 2 VIEWS COMPARISON:  None Available. FINDINGS: No recent fracture is seen. Degenerative changes are noted with bony spurs and thoracic spine, more so in the lower thoracic spine. There is mild dextroscoliosis. Visualized lung fields are clear. Surgical clips are seen in gallbladder fossa. IMPRESSION: No recent fracture is seen. Degenerative changes are noted with bony spurs. Electronically Signed   By: Ernie Avena M.D.   On: 06/23/2022 13:16    Procedures Procedures (including critical care time)  Medications Ordered in UC Medications  dexamethasone (DECADRON) injection 10 mg (has no administration in time range)    Initial Impression / Assessment and Plan / UC Course  I have reviewed the triage vital signs and the nursing notes.  Pertinent labs & imaging results that were available during my care of the patient were reviewed by me and considered in my medical decision making (see chart for details).  The patient is well-appearing, she is in no acute distress, vital signs are stable.  X-ray is negative for fracture or dislocation; however, patient does have degenerative changes and bony spurring noted throughout the thoracic spine.  Decadron 10 mg IM was  administered to help with possible inflammation.  Supportive care recommendations were provided and discussed with the patient to include use of Tylenol arthritis strength tablets for pain or discomfort, use of ice or heat, and gentle  stretching exercises.  Patient was advised that if symptoms do not improve or if they recur, recommend that she follow-up with orthopedics for further evaluation.  Patient was given strict ER follow-up precautions.  Patient is in agreement with this plan of care and verbalizes understanding.  All questions were answered.  Patient stable for discharge.   Final Clinical Impressions(s) / UC Diagnoses   Final diagnoses:  Thoracic spine pain     Discharge Instructions      The x-ray is negative for fracture or dislocation; however, it does show degenerative changes in your mid back, which may be causing you to have back pain. Take medication as prescribed. Recommend Tylenol arthritis strength 650 mg tablets, take 1 tablet every 8 hours as needed for back pain or discomfort. May apply ice or heat as needed.  Apply ice for pain or swelling, heat for spasm or stiffness.  Apply for 20 minutes, remove for 1 hour, then repeat as needed. Try to remain as active as possible. If your symptoms continue to recur, or fail to improve, it is recommended that you follow-up with orthopedics for further evaluation. Go to the emergency department immediately if you become unable to walk, have numbness or tingling in your hands or feet, or develop difficulty breathing. Follow-up as needed.     ED Prescriptions     Medication Sig Dispense Auth. Provider   tizanidine (ZANAFLEX) 2 MG capsule  (Status: Discontinued) Take 1 capsule (2 mg total) by mouth in the morning and at bedtime. 20 capsule Laverne Hursey-Warren, Sadie Haber, NP   tiZANidine (ZANAFLEX) 2 MG tablet Take 1 tablet (2 mg total) by mouth 2 (two) times daily as needed for muscle spasms. 20 tablet Hedaya Latendresse-Warren, Sadie Haber, NP       PDMP not reviewed this encounter.   Abran Cantor, NP 06/23/22 1344

## 2022-06-26 ENCOUNTER — Emergency Department (HOSPITAL_COMMUNITY): Payer: PPO

## 2022-06-26 ENCOUNTER — Emergency Department (HOSPITAL_COMMUNITY)
Admission: EM | Admit: 2022-06-26 | Discharge: 2022-06-26 | Disposition: A | Discharge status: Home / Self Care | Payer: PPO | Attending: Emergency Medicine | Admitting: Emergency Medicine

## 2022-06-26 DIAGNOSIS — M549 Dorsalgia, unspecified: Secondary | ICD-10-CM | POA: Diagnosis not present

## 2022-06-26 DIAGNOSIS — M545 Low back pain, unspecified: Secondary | ICD-10-CM | POA: Insufficient documentation

## 2022-06-26 DIAGNOSIS — R300 Dysuria: Secondary | ICD-10-CM | POA: Diagnosis present

## 2022-06-26 DIAGNOSIS — N390 Urinary tract infection, site not specified: Secondary | ICD-10-CM | POA: Insufficient documentation

## 2022-06-26 LAB — URINALYSIS, W/ REFLEX TO CULTURE (INFECTION SUSPECTED)
Bilirubin Urine: NEGATIVE
Glucose, UA: NEGATIVE mg/dL
Ketones, ur: NEGATIVE mg/dL
Nitrite: NEGATIVE
Protein, ur: 30 mg/dL — AB
Specific Gravity, Urine: 1.004 — ABNORMAL LOW (ref 1.005–1.030)
Trans Epithel, UA: <1
WBC, UA: >50 WBC/hpf (ref 0–5)
pH: 5 (ref 5.0–8.0)

## 2022-06-26 MED ORDER — FOSFOMYCIN TROMETHAMINE 3 G PO PACK
3.0000 g | PACK | Freq: Once | ORAL | Status: AC
  Administered 2022-06-26: 3 g via ORAL
  Filled 2022-06-26: qty 3

## 2022-06-26 MED ORDER — PHENAZOPYRIDINE HCL 200 MG PO TABS: 200.0000 mg | ORAL_TABLET | Freq: Three times a day (TID) | ORAL | 0 refills | Status: AC | PRN

## 2022-06-26 MED ORDER — PHENAZOPYRIDINE HCL 100 MG PO TABS
200.0000 mg | ORAL_TABLET | Freq: Once | ORAL | Status: AC
  Administered 2022-06-26: 200 mg via ORAL
  Filled 2022-06-26: qty 2

## 2022-06-26 NOTE — ED Provider Notes (Signed)
[facility] Provider Note   CSN: [id number] Arrival date & time: 06/26/22  0006     History  Chief Complaint  Patient presents with   Dysuria    Patient states she is having pain and increased frequency of urine that started today. She has also been having increased back pain for the last several days.    Sheila Humphrey is a 77 y.o. female.  Patient presents to the emergency department for evaluation for left-sided back pain and increased urination.  Patient has been experiencing left-sided back pain for several days.  She went to urgent care and had some x-rays performed for this.  Over the last 24 hours or so, however, she started to notice progressively worsening urinary frequency and dysuria.       Home Medications Prior to Admission medications   Medication Sig Start Date End Date Taking? Authorizing Provider  phenazopyridine (PYRIDIUM) 200 MG tablet Take 1 tablet (200 mg total) by mouth 3 (three) times daily as needed for pain. 06/26/22  Yes Amy Belloso, Canary Brim, MD  atorvastatin (LIPITOR) 20 MG tablet Take 20 mg by mouth daily.    [provider]  Cholecalciferol (VITAMIN D) 50 MCG (2000 UT) CAPS Take 2,000 Units by mouth.    Benita Stabile, MD  denosumab (PROLIA) 60 MG/ML SOSY injection Inject 60 mg into the skin every 6 (six) months. 03/26/20   Benita Stabile, MD  medroxyPROGESTERone (PROVERA) 10 MG tablet TAKE ONE TABLET BY MOUTH ONCE DAILY    Lazaro Arms, MD  Olopatadine HCl 0.2 % SOLN Apply 1 drop to eye daily.    [provider]  tiZANidine (ZANAFLEX) 2 MG tablet Take 1 tablet (2 mg total) by mouth 2 (two) times daily as needed for muscle spasms. 06/23/22   Leath-Warren, Sadie Haber, NP      Allergies    Cefuroxime axetil and Other    Review of Systems   Review of Systems  Physical Exam Updated Vital Signs BP (!) 145/74 (BP Location: Right Arm)   Pulse 84   Temp 98.1 F (36.7 C) (Oral)   Resp 19    Ht 5' (1.524 m)   Wt 81.6 kg   SpO2 99%   BMI 35.15 kg/m  Physical Exam Vitals and nursing note reviewed.  Constitutional:      General: She is not in acute distress.    Appearance: She is well-developed.  HENT:     Head: Normocephalic and atraumatic.     Mouth/Throat:     Mouth: Mucous membranes are moist.  Eyes:     General: Vision grossly intact. Gaze aligned appropriately.     Extraocular Movements: Extraocular movements intact.     Conjunctiva/sclera: Conjunctivae normal.  Cardiovascular:     Rate and Rhythm: Normal rate and regular rhythm.     Pulses: Normal pulses.     Heart sounds: Normal heart sounds, S1 normal and S2 normal. No murmur heard.    No friction rub. No gallop.  Pulmonary:     Effort: Pulmonary effort is normal. No respiratory distress.     Breath sounds: Normal breath sounds.  Abdominal:     General: Bowel sounds are normal.     Palpations: Abdomen is soft.     Tenderness: There is no abdominal tenderness. There is no guarding or rebound.     Hernia: No hernia is present.  Musculoskeletal:        General: No swelling.  Cervical back: Full passive range of motion without pain, normal range of motion and neck supple. No spinous process tenderness or muscular tenderness. Normal range of motion.       Back:     Right lower leg: No edema.     Left lower leg: No edema.  Skin:    General: Skin is warm and dry.     Capillary Refill: Capillary refill takes less than 2 seconds.     Findings: No ecchymosis, erythema, rash or wound.  Neurological:     General: No focal deficit present.     Mental Status: She is alert and oriented to person, place, and time.     GCS: GCS eye subscore is 4. GCS verbal subscore is 5. GCS motor subscore is 6.     Cranial Nerves: Cranial nerves 2-12 are intact.     Sensory: Sensation is intact.     Motor: Motor function is intact.     Coordination: Coordination is intact.  Psychiatric:        Attention and Perception:  Attention normal.        Mood and Affect: Mood normal.        Speech: Speech normal.        Behavior: Behavior normal.     ED Results / Procedures / Treatments   Labs (all labs ordered are listed, but only abnormal results are displayed) Labs Reviewed  URINALYSIS, W/ REFLEX TO CULTURE (INFECTION SUSPECTED) - Abnormal; Notable for the following components:      Result Value   APPearance HAZY (*)    Specific Gravity, Urine 1.004 (*)    Hgb urine dipstick LARGE (*)    Protein, ur 30 (*)    Leukocytes,Ua LARGE (*)    Bacteria, UA MANY (*)    All other components within normal limits  URINE CULTURE    EKG None  Radiology CT RENAL STONE STUDY  Result Date: 06/26/2022 CLINICAL DATA:  Back pain for 2 days on the left, initial encounter EXAM: CT ABDOMEN AND PELVIS WITHOUT CONTRAST TECHNIQUE: Multidetector CT imaging of the abdomen and pelvis was performed following the standard protocol without IV contrast. RADIATION DOSE REDUCTION: This exam was performed according to the departmental dose-optimization program which includes automated exposure control, adjustment of the mA and/or kV according to patient size and/or use of iterative reconstruction technique. COMPARISON:  07/07/2011 FINDINGS: Lower chest: No acute abnormality. Hepatobiliary: No focal liver abnormality is seen. Status post cholecystectomy. No biliary dilatation. Pancreas: Unremarkable. No pancreatic ductal dilatation or surrounding inflammatory changes. Spleen: Normal in size without focal abnormality. Adrenals/Urinary Tract: Adrenal glands are within normal limits. Kidneys are well visualized bilaterally. No renal calculi or obstructive changes are seen. Bladder is decompressed. Stomach/Bowel: No obstructive or inflammatory changes of the colon are seen. Minimal diverticular changes noted. The appendix is within normal limits. Small bowel and stomach are unremarkable. Vascular/Lymphatic: No significant vascular findings are  present. No enlarged abdominal or pelvic lymph nodes. Reproductive: Uterus and bilateral adnexa are unremarkable. Other: No abdominal wall hernia or abnormality. No abdominopelvic ascites. Musculoskeletal: No acute or significant osseous findings. IMPRESSION: No renal calculi or obstructive changes are noted. Electronically Signed   By: Alcide Clever M.D.   On: 06/26/2022 01:33    Procedures Procedures    Medications Ordered in ED Medications  fosfomycin (MONUROL) packet 3 g (has no administration in time range)  phenazopyridine (PYRIDIUM) tablet 200 mg (has no administration in time range)    ED Course/ Medical  Decision Making/ A&P                             Medical Decision Making Amount and/or Complexity of Data Reviewed Labs: ordered. Radiology: ordered.  Risk Prescription drug management.   Differential Diagnosis considered includes, but not limited to: Renal colic/kidney stone; pyelonephritis; aortic dissection; musculoskeletal pain.  Patient with left-sided back pain that has been ongoing for days.  She is now experiencing urinary frequency and dysuria.  Patient does not appear unwell.  She is afebrile.  Vital signs are unremarkable.  She does have some soft tissue tenderness on the left side of her back.  She did have x-rays performed at urgent care this week for this that were negative.  Her urinalysis today is consistent with urinary infection.  She underwent CT renal stone study to evaluate for pyelonephritis or concomitant ureterolithiasis.  No acute abnormality noted.  Will treat with fosfomycin, Pyridium.        Final Clinical Impression(s) / ED Diagnoses Final diagnoses:  Lower urinary tract infectious disease    Rx / DC Orders ED Discharge Orders          Ordered    phenazopyridine (PYRIDIUM) 200 MG tablet  3 times daily PRN        06/26/22 0138              Gilda Crease, MD 06/26/22 0139

## 2022-06-27 DIAGNOSIS — M545 Low back pain, unspecified: Secondary | ICD-10-CM | POA: Diagnosis not present

## 2022-06-27 DIAGNOSIS — R300 Dysuria: Secondary | ICD-10-CM | POA: Diagnosis not present

## 2022-06-28 LAB — URINE CULTURE: Culture: >=100000 — AB

## 2022-06-29 ENCOUNTER — Telehealth (HOSPITAL_BASED_OUTPATIENT_CLINIC_OR_DEPARTMENT_OTHER): Payer: Self-pay | Admitting: *Deleted

## 2022-06-29 NOTE — Telephone Encounter (Signed)
Post ED Visit - Positive Culture Follow-up  Culture report reviewed by antimicrobial stewardship pharmacist: [facility] []  Enzo Bi, Pharm.D. []  Celedonio Miyamoto, Pharm.D., BCPS AQ-ID []  Garvin Fila, Pharm.D., BCPS []  Georgina Pillion, Pharm.D., BCPS []  [place], [address].D., BCPS, AAHIVP []  Estella Husk, Pharm.D., BCPS, AAHIVP []  Lysle Pearl, PharmD, BCPS []  Phillips Climes, PharmD, BCPS []  Agapito Games, PharmD, BCPS []  Verlan Friends, PharmD []  Mervyn Gay, PharmD, BCPS [x]  Doreen Beam, PharmD  [facility] []  Len Childs, PharmD []  Greer Pickerel, PharmD []  Adalberto Cole, PharmD []  Perlie Gold, Rph []  Lonell Face) Jean Rosenthal, PharmD []  Earl Many, PharmD []  Junita Push, PharmD []  Dorna Leitz, PharmD []  Terrilee Files, PharmD []  Lynann Beaver, PharmD []  Keturah Barre, PharmD []  Loralee Pacas, PharmD []  Bernadene Person, PharmD   Positive urine culture Treated with Fosfomycin and no further patient follow-up is required at this time.  Sheila Humphrey 06/29/2022, 12:50 PM

## 2022-06-30 DIAGNOSIS — H40022 Open angle with borderline findings, high risk, left eye: Secondary | ICD-10-CM | POA: Diagnosis not present

## 2022-06-30 DIAGNOSIS — H401111 Primary open-angle glaucoma, right eye, mild stage: Secondary | ICD-10-CM | POA: Diagnosis not present

## 2022-07-08 ENCOUNTER — Other Ambulatory Visit (HOSPITAL_COMMUNITY): Payer: Self-pay | Admitting: Internal Medicine

## 2022-07-08 DIAGNOSIS — Z1231 Encounter for screening mammogram for malignant neoplasm of breast: Secondary | ICD-10-CM

## 2022-07-31 DIAGNOSIS — N1831 Chronic kidney disease, stage 3a: Secondary | ICD-10-CM | POA: Diagnosis not present

## 2022-08-06 ENCOUNTER — Other Ambulatory Visit: Payer: Self-pay

## 2022-08-06 DIAGNOSIS — N1831 Chronic kidney disease, stage 3a: Secondary | ICD-10-CM | POA: Diagnosis not present

## 2022-08-06 DIAGNOSIS — M810 Age-related osteoporosis without current pathological fracture: Secondary | ICD-10-CM | POA: Diagnosis not present

## 2022-08-06 DIAGNOSIS — E782 Mixed hyperlipidemia: Secondary | ICD-10-CM | POA: Diagnosis not present

## 2022-08-06 DIAGNOSIS — Z2821 Immunization not carried out because of patient refusal: Secondary | ICD-10-CM | POA: Diagnosis not present

## 2022-08-06 DIAGNOSIS — Z79899 Other long term (current) drug therapy: Secondary | ICD-10-CM | POA: Diagnosis not present

## 2022-08-06 DIAGNOSIS — R945 Abnormal results of liver function studies: Secondary | ICD-10-CM | POA: Diagnosis not present

## 2022-08-18 ENCOUNTER — Encounter (HOSPITAL_COMMUNITY): Payer: Self-pay

## 2022-08-18 ENCOUNTER — Ambulatory Visit (HOSPITAL_COMMUNITY)
Admission: RE | Admit: 2022-08-18 | Discharge: 2022-08-18 | Disposition: A | Discharge status: Home / Self Care | Payer: PPO | Source: Ambulatory Visit | Attending: Internal Medicine | Admitting: Internal Medicine

## 2022-08-18 DIAGNOSIS — Z1231 Encounter for screening mammogram for malignant neoplasm of breast: Secondary | ICD-10-CM | POA: Insufficient documentation

## 2022-09-04 ENCOUNTER — Telehealth: Payer: Self-pay

## 2022-09-04 NOTE — Telephone Encounter (Signed)
Auth Submission: NO AUTH NEEDED Site of care: Site of care: [facility] Payer: [employer] Medication & CPT/J Code(s) submitted: Prolia (Denosumab) [id number]  Auth type: Buy/Bill HB Units/visits requested: 60mg  q 6 months  Approval from: 09/04/22 to 01/04/23

## 2022-09-04 NOTE — Telephone Encounter (Signed)
Auth Submission: NO AUTH NEEDED Site of care: Site of care: [facility] Payer: [employer] Medication & CPT/J Code(s) submitted: Prolia (Denosumab) [id number] Route of submission (phone, fax, portal): phone Phone # Fax # Auth type: Buy/Bill PB Units/visits requested: q34months Reference number: [id number] Approval from: 09/04/22 to 01/04/23

## 2022-09-25 ENCOUNTER — Encounter (HOSPITAL_COMMUNITY)
Admission: RE | Admit: 2022-09-25 | Discharge: 2022-09-25 | Disposition: A | Discharge status: Home / Self Care | Payer: PPO | Source: Ambulatory Visit | Attending: Internal Medicine | Admitting: Internal Medicine

## 2022-09-25 DIAGNOSIS — M810 Age-related osteoporosis without current pathological fracture: Secondary | ICD-10-CM | POA: Insufficient documentation

## 2022-09-29 ENCOUNTER — Encounter (INDEPENDENT_AMBULATORY_CARE_PROVIDER_SITE_OTHER): Payer: PPO | Admitting: *Deleted

## 2022-09-29 ENCOUNTER — Encounter (HOSPITAL_COMMUNITY): Admission: RE | Admit: 2022-09-29 | Payer: PPO | Source: Ambulatory Visit

## 2022-09-29 VITALS — BP 159/83 | HR 89 | Temp 99.0°F | Resp 16

## 2022-09-29 DIAGNOSIS — M810 Age-related osteoporosis without current pathological fracture: Secondary | ICD-10-CM | POA: Diagnosis not present

## 2022-09-29 MED ORDER — DENOSUMAB 60 MG/ML ~~LOC~~ SOSY
60.0000 mg | PREFILLED_SYRINGE | Freq: Once | SUBCUTANEOUS | Status: AC
  Administered 2022-09-29: 60 mg via SUBCUTANEOUS

## 2022-09-29 NOTE — Progress Notes (Signed)
Diagnosis: Osteoporosis  Provider:  Dwana Melena MD  Procedure: Injection  Prolia (Denosumab), Dose: 60 mg, Site: subcutaneous, Number of injections: 1  Post Care: Observation period completed  Discharge: Condition: Good, Destination: Home . AVS Provided9i  Performed by:  Forrest Moron, RN

## 2022-09-30 DIAGNOSIS — H35371 Puckering of macula, right eye: Secondary | ICD-10-CM | POA: Diagnosis not present

## 2022-09-30 DIAGNOSIS — H40022 Open angle with borderline findings, high risk, left eye: Secondary | ICD-10-CM | POA: Diagnosis not present

## 2022-09-30 DIAGNOSIS — H401112 Primary open-angle glaucoma, right eye, moderate stage: Secondary | ICD-10-CM | POA: Diagnosis not present

## 2022-12-03 DIAGNOSIS — M810 Age-related osteoporosis without current pathological fracture: Secondary | ICD-10-CM | POA: Diagnosis not present

## 2022-12-03 DIAGNOSIS — N1831 Chronic kidney disease, stage 3a: Secondary | ICD-10-CM | POA: Diagnosis not present

## 2022-12-09 ENCOUNTER — Other Ambulatory Visit (HOSPITAL_COMMUNITY): Payer: Self-pay | Admitting: Family Medicine

## 2022-12-09 DIAGNOSIS — E782 Mixed hyperlipidemia: Secondary | ICD-10-CM | POA: Diagnosis not present

## 2022-12-09 DIAGNOSIS — R7303 Prediabetes: Secondary | ICD-10-CM | POA: Diagnosis not present

## 2022-12-09 DIAGNOSIS — M810 Age-related osteoporosis without current pathological fracture: Secondary | ICD-10-CM

## 2022-12-09 DIAGNOSIS — N1831 Chronic kidney disease, stage 3a: Secondary | ICD-10-CM | POA: Diagnosis not present

## 2022-12-09 DIAGNOSIS — Z0001 Encounter for general adult medical examination with abnormal findings: Secondary | ICD-10-CM | POA: Diagnosis not present

## 2022-12-09 DIAGNOSIS — Z0000 Encounter for general adult medical examination without abnormal findings: Secondary | ICD-10-CM | POA: Diagnosis not present

## 2022-12-25 ENCOUNTER — Ambulatory Visit (HOSPITAL_COMMUNITY)
Admission: RE | Admit: 2022-12-25 | Discharge: 2022-12-25 | Disposition: A | Discharge status: Home / Self Care | Payer: PPO | Source: Ambulatory Visit | Attending: Family Medicine | Admitting: Family Medicine

## 2022-12-25 DIAGNOSIS — M810 Age-related osteoporosis without current pathological fracture: Secondary | ICD-10-CM | POA: Diagnosis not present

## 2022-12-25 DIAGNOSIS — Z780 Asymptomatic menopausal state: Secondary | ICD-10-CM | POA: Diagnosis not present

## 2023-01-26 ENCOUNTER — Other Ambulatory Visit: Payer: Self-pay | Admitting: Internal Medicine

## 2023-02-05 DIAGNOSIS — H401112 Primary open-angle glaucoma, right eye, moderate stage: Secondary | ICD-10-CM | POA: Diagnosis not present

## 2023-02-05 DIAGNOSIS — H40022 Open angle with borderline findings, high risk, left eye: Secondary | ICD-10-CM | POA: Diagnosis not present

## 2023-02-10 ENCOUNTER — Telehealth: Payer: Self-pay

## 2023-02-10 NOTE — Telephone Encounter (Signed)
Auth Submission: NO AUTH NEEDED Site of care: Site of care: [facility] Payer: [employer] Medication & CPT/J Code(s) submitted: Prolia (Denosumab) [id number] Route of submission (phone, fax, portal): phone Phone # Fax # Auth type: Buy/Bill PB Units/visits requested: 60mg , q70months Reference number: [id number]  Approval from: 02/10/23 to 01/20/24

## 2023-03-30 ENCOUNTER — Encounter: Payer: PPO | Attending: Dermatology | Admitting: *Deleted

## 2023-03-30 VITALS — BP 123/53 | Temp 98.5°F | Resp 16

## 2023-03-30 DIAGNOSIS — M810 Age-related osteoporosis without current pathological fracture: Secondary | ICD-10-CM

## 2023-03-30 MED ORDER — DENOSUMAB 60 MG/ML ~~LOC~~ SOSY
60.0000 mg | PREFILLED_SYRINGE | Freq: Once | SUBCUTANEOUS | Status: AC
  Administered 2023-03-30: 60 mg via SUBCUTANEOUS

## 2023-03-30 NOTE — Progress Notes (Signed)
 Diagnosis: Osteoporosis  Provider:  Dwana Melena MD  Procedure: Injection  Prolia (Denosumab), Dose: 60 mg, Site: subcutaneous, Number of injections: 1  Injection Site(s): Left arm  Post Care: Observation period completed  Discharge: Condition: Good, Destination: Home . AVS Provided  Performed by:  Daleen Squibb, RN

## 2023-06-05 DIAGNOSIS — H401112 Primary open-angle glaucoma, right eye, moderate stage: Secondary | ICD-10-CM | POA: Diagnosis not present

## 2023-06-05 DIAGNOSIS — H40022 Open angle with borderline findings, high risk, left eye: Secondary | ICD-10-CM | POA: Diagnosis not present

## 2023-06-09 DIAGNOSIS — M810 Age-related osteoporosis without current pathological fracture: Secondary | ICD-10-CM | POA: Diagnosis not present

## 2023-06-09 DIAGNOSIS — N1831 Chronic kidney disease, stage 3a: Secondary | ICD-10-CM | POA: Diagnosis not present

## 2023-06-16 DIAGNOSIS — E782 Mixed hyperlipidemia: Secondary | ICD-10-CM | POA: Diagnosis not present

## 2023-06-16 DIAGNOSIS — N1831 Chronic kidney disease, stage 3a: Secondary | ICD-10-CM | POA: Diagnosis not present

## 2023-06-16 DIAGNOSIS — M810 Age-related osteoporosis without current pathological fracture: Secondary | ICD-10-CM | POA: Diagnosis not present

## 2023-06-16 DIAGNOSIS — R7303 Prediabetes: Secondary | ICD-10-CM | POA: Diagnosis not present

## 2023-06-16 DIAGNOSIS — Z1211 Encounter for screening for malignant neoplasm of colon: Secondary | ICD-10-CM | POA: Diagnosis not present

## 2023-07-08 DIAGNOSIS — H401112 Primary open-angle glaucoma, right eye, moderate stage: Secondary | ICD-10-CM | POA: Diagnosis not present

## 2023-07-08 DIAGNOSIS — H40022 Open angle with borderline findings, high risk, left eye: Secondary | ICD-10-CM | POA: Diagnosis not present

## 2023-07-13 ENCOUNTER — Other Ambulatory Visit (HOSPITAL_COMMUNITY): Payer: Self-pay | Admitting: Internal Medicine

## 2023-07-13 DIAGNOSIS — Z1231 Encounter for screening mammogram for malignant neoplasm of breast: Secondary | ICD-10-CM

## 2023-08-24 ENCOUNTER — Ambulatory Visit (HOSPITAL_COMMUNITY)
Admission: RE | Admit: 2023-08-24 | Discharge: 2023-08-24 | Disposition: A | Discharge status: Home / Self Care | Source: Ambulatory Visit | Attending: Internal Medicine | Admitting: Internal Medicine

## 2023-08-24 DIAGNOSIS — Z1231 Encounter for screening mammogram for malignant neoplasm of breast: Secondary | ICD-10-CM | POA: Insufficient documentation

## 2023-08-28 ENCOUNTER — Other Ambulatory Visit: Payer: Self-pay | Admitting: Internal Medicine

## 2023-08-28 ENCOUNTER — Telehealth: Payer: Self-pay

## 2023-08-28 NOTE — Telephone Encounter (Signed)
 Auth Submission: NO AUTH NEEDED Site of care: Site of care: [facility] Payer: [employer] Medication & CPT/J Code(s) submitted: Prolia  (Denosumab ) [id number] Diagnosis Code:  Route of submission (phone, fax, portal): phone Phone # Fax # Auth type: Buy/Bill PB Units/visits requested: 60mg  q78months, x 2 doses Reference number: [id number] Approval from: 08/28/23 to 01/20/24

## 2023-10-01 ENCOUNTER — Encounter: Attending: Internal Medicine | Admitting: *Deleted

## 2023-10-01 VITALS — BP 136/77 | HR 82 | Temp 98.0°F | Resp 16

## 2023-10-01 DIAGNOSIS — M810 Age-related osteoporosis without current pathological fracture: Secondary | ICD-10-CM

## 2023-10-01 MED ORDER — DENOSUMAB 60 MG/ML ~~LOC~~ SOSY
60.0000 mg | PREFILLED_SYRINGE | Freq: Once | SUBCUTANEOUS | Status: AC
  Administered 2023-10-01: 60 mg via SUBCUTANEOUS

## 2023-10-01 NOTE — Progress Notes (Signed)
 Diagnosis: Osteoporosis  Provider:  Dwana Melena MD  Procedure: Injection  Prolia (Denosumab), Dose: 60 mg, Site: subcutaneous, Number of injections: 1  Injection Site(s): Left arm  Post Care: Observation period completed  Discharge: Condition: Good, Destination: Home . AVS Provided  Performed by:  Daleen Squibb, RN

## 2023-10-09 DIAGNOSIS — H35371 Puckering of macula, right eye: Secondary | ICD-10-CM | POA: Diagnosis not present

## 2023-10-09 DIAGNOSIS — H401112 Primary open-angle glaucoma, right eye, moderate stage: Secondary | ICD-10-CM | POA: Diagnosis not present

## 2023-10-09 DIAGNOSIS — H04123 Dry eye syndrome of bilateral lacrimal glands: Secondary | ICD-10-CM | POA: Diagnosis not present

## 2023-10-09 DIAGNOSIS — H524 Presbyopia: Secondary | ICD-10-CM | POA: Diagnosis not present

## 2023-10-09 DIAGNOSIS — H40022 Open angle with borderline findings, high risk, left eye: Secondary | ICD-10-CM | POA: Diagnosis not present

## 2023-12-10 DIAGNOSIS — M810 Age-related osteoporosis without current pathological fracture: Secondary | ICD-10-CM | POA: Diagnosis not present

## 2023-12-10 DIAGNOSIS — N1831 Chronic kidney disease, stage 3a: Secondary | ICD-10-CM | POA: Diagnosis not present

## 2023-12-16 DIAGNOSIS — E782 Mixed hyperlipidemia: Secondary | ICD-10-CM | POA: Diagnosis not present

## 2023-12-16 DIAGNOSIS — N1831 Chronic kidney disease, stage 3a: Secondary | ICD-10-CM | POA: Diagnosis not present

## 2023-12-16 DIAGNOSIS — Z0001 Encounter for general adult medical examination with abnormal findings: Secondary | ICD-10-CM | POA: Diagnosis not present

## 2023-12-16 DIAGNOSIS — I129 Hypertensive chronic kidney disease with stage 1 through stage 4 chronic kidney disease, or unspecified chronic kidney disease: Secondary | ICD-10-CM | POA: Diagnosis not present

## 2023-12-16 DIAGNOSIS — Z0000 Encounter for general adult medical examination without abnormal findings: Secondary | ICD-10-CM | POA: Diagnosis not present

## 2023-12-16 DIAGNOSIS — M810 Age-related osteoporosis without current pathological fracture: Secondary | ICD-10-CM | POA: Diagnosis not present

## 2023-12-16 DIAGNOSIS — R7303 Prediabetes: Secondary | ICD-10-CM | POA: Diagnosis not present

## 2024-03-31 ENCOUNTER — Ambulatory Visit
